# Patient Record
Sex: Female | Born: 1955
Health system: Southern US, Community
[De-identification: ages and names within clinical notes are randomized; demographics above are authoritative.]

## PROBLEM LIST (undated history)

## (undated) DIAGNOSIS — M810 Age-related osteoporosis without current pathological fracture: Secondary | ICD-10-CM

## (undated) DIAGNOSIS — Z860100 Personal history of colon polyps, unspecified: Secondary | ICD-10-CM

## (undated) DIAGNOSIS — K8020 Calculus of gallbladder without cholecystitis without obstruction: Secondary | ICD-10-CM

## (undated) DIAGNOSIS — F419 Anxiety disorder, unspecified: Secondary | ICD-10-CM

## (undated) DIAGNOSIS — M719 Bursopathy, unspecified: Secondary | ICD-10-CM

## (undated) DIAGNOSIS — I4891 Unspecified atrial fibrillation: Secondary | ICD-10-CM

## (undated) DIAGNOSIS — D219 Benign neoplasm of connective and other soft tissue, unspecified: Secondary | ICD-10-CM

## (undated) DIAGNOSIS — E7800 Pure hypercholesterolemia, unspecified: Secondary | ICD-10-CM

## (undated) DIAGNOSIS — N809 Endometriosis, unspecified: Secondary | ICD-10-CM

## (undated) DIAGNOSIS — K219 Gastro-esophageal reflux disease without esophagitis: Secondary | ICD-10-CM

## (undated) DIAGNOSIS — M1990 Unspecified osteoarthritis, unspecified site: Secondary | ICD-10-CM

## (undated) DIAGNOSIS — E039 Hypothyroidism, unspecified: Secondary | ICD-10-CM

## (undated) HISTORY — PX: TONSILLECTOMY AND ADENOIDECTOMY: SUR1326

## (undated) HISTORY — PX: COLONOSCOPY: SHX174

## (undated) HISTORY — DX: Anxiety disorder, unspecified: F41.9

## (undated) HISTORY — DX: Unspecified atrial fibrillation: I48.91

## (undated) HISTORY — DX: Calculus of gallbladder without cholecystitis without obstruction: K80.20

## (undated) HISTORY — DX: Age-related osteoporosis without current pathological fracture: M81.0

## (undated) HISTORY — DX: Gastro-esophageal reflux disease without esophagitis: K21.9

## (undated) HISTORY — DX: Personal history of colonic polyps: Z86.010

## (undated) HISTORY — DX: Pure hypercholesterolemia, unspecified: E78.00

## (undated) HISTORY — DX: Benign neoplasm of connective and other soft tissue, unspecified: D21.9

## (undated) HISTORY — DX: Personal history of colon polyps, unspecified: Z86.0100

## (undated) HISTORY — DX: Bursopathy, unspecified: M71.9

## (undated) HISTORY — DX: Endometriosis, unspecified: N80.9

## (undated) HISTORY — DX: Unspecified osteoarthritis, unspecified site: M19.90

---

## 1998-04-09 ENCOUNTER — Other Ambulatory Visit: Admission: RE | Admit: 1998-04-09 | Discharge: 1998-04-09 | Payer: Self-pay | Admitting: Obstetrics and Gynecology

## 1999-04-15 ENCOUNTER — Other Ambulatory Visit: Admission: RE | Admit: 1999-04-15 | Discharge: 1999-04-15 | Payer: Self-pay | Admitting: Obstetrics and Gynecology

## 1999-07-15 ENCOUNTER — Encounter: Payer: Self-pay | Admitting: Obstetrics and Gynecology

## 1999-07-15 ENCOUNTER — Encounter: Admission: RE | Admit: 1999-07-15 | Discharge: 1999-07-15 | Payer: Self-pay | Admitting: Obstetrics and Gynecology

## 2000-05-25 ENCOUNTER — Other Ambulatory Visit: Admission: RE | Admit: 2000-05-25 | Discharge: 2000-05-25 | Payer: Self-pay | Admitting: Obstetrics and Gynecology

## 2000-08-03 ENCOUNTER — Encounter: Payer: Self-pay | Admitting: Obstetrics and Gynecology

## 2000-08-03 ENCOUNTER — Encounter: Admission: RE | Admit: 2000-08-03 | Discharge: 2000-08-03 | Payer: Self-pay | Admitting: Obstetrics and Gynecology

## 2001-06-14 ENCOUNTER — Other Ambulatory Visit: Admission: RE | Admit: 2001-06-14 | Discharge: 2001-06-14 | Payer: Self-pay | Admitting: Obstetrics and Gynecology

## 2001-08-09 ENCOUNTER — Encounter: Admission: RE | Admit: 2001-08-09 | Discharge: 2001-08-09 | Payer: Self-pay | Admitting: Obstetrics and Gynecology

## 2001-08-09 ENCOUNTER — Encounter: Payer: Self-pay | Admitting: Obstetrics and Gynecology

## 2002-05-30 ENCOUNTER — Other Ambulatory Visit: Admission: RE | Admit: 2002-05-30 | Discharge: 2002-05-30 | Payer: Self-pay | Admitting: Obstetrics and Gynecology

## 2002-08-15 ENCOUNTER — Encounter: Admission: RE | Admit: 2002-08-15 | Discharge: 2002-08-15 | Payer: Self-pay | Admitting: Obstetrics and Gynecology

## 2002-08-15 ENCOUNTER — Encounter: Payer: Self-pay | Admitting: Obstetrics and Gynecology

## 2003-06-05 ENCOUNTER — Other Ambulatory Visit: Admission: RE | Admit: 2003-06-05 | Discharge: 2003-06-05 | Payer: Self-pay | Admitting: Obstetrics and Gynecology

## 2003-08-28 ENCOUNTER — Encounter: Admission: RE | Admit: 2003-08-28 | Discharge: 2003-08-28 | Payer: Self-pay | Admitting: Obstetrics and Gynecology

## 2004-06-17 ENCOUNTER — Other Ambulatory Visit: Admission: RE | Admit: 2004-06-17 | Discharge: 2004-06-17 | Payer: Self-pay | Admitting: Obstetrics and Gynecology

## 2004-09-16 ENCOUNTER — Encounter: Admission: RE | Admit: 2004-09-16 | Discharge: 2004-09-16 | Payer: Self-pay | Admitting: Obstetrics and Gynecology

## 2005-08-14 ENCOUNTER — Other Ambulatory Visit: Admission: RE | Admit: 2005-08-14 | Discharge: 2005-08-14 | Payer: Self-pay | Admitting: Obstetrics and Gynecology

## 2005-09-22 ENCOUNTER — Encounter: Admission: RE | Admit: 2005-09-22 | Discharge: 2005-09-22 | Payer: Self-pay | Admitting: Obstetrics and Gynecology

## 2006-08-31 ENCOUNTER — Other Ambulatory Visit: Admission: RE | Admit: 2006-08-31 | Discharge: 2006-08-31 | Payer: Self-pay | Admitting: Obstetrics and Gynecology

## 2006-09-28 ENCOUNTER — Encounter: Admission: RE | Admit: 2006-09-28 | Discharge: 2006-09-28 | Payer: Self-pay | Admitting: Obstetrics and Gynecology

## 2007-10-04 ENCOUNTER — Encounter: Admission: RE | Admit: 2007-10-04 | Discharge: 2007-10-04 | Payer: Self-pay | Admitting: Obstetrics and Gynecology

## 2007-11-08 ENCOUNTER — Other Ambulatory Visit: Admission: RE | Admit: 2007-11-08 | Discharge: 2007-11-08 | Payer: Self-pay | Admitting: Obstetrics and Gynecology

## 2008-10-09 ENCOUNTER — Encounter: Admission: RE | Admit: 2008-10-09 | Discharge: 2008-10-09 | Payer: Self-pay | Admitting: Obstetrics and Gynecology

## 2008-11-13 ENCOUNTER — Other Ambulatory Visit: Admission: RE | Admit: 2008-11-13 | Discharge: 2008-11-13 | Payer: Self-pay | Admitting: Obstetrics and Gynecology

## 2008-11-17 DIAGNOSIS — N8003 Adenomyosis of the uterus: Secondary | ICD-10-CM

## 2008-11-17 DIAGNOSIS — N800 Endometriosis of uterus: Secondary | ICD-10-CM

## 2008-11-17 DIAGNOSIS — D219 Benign neoplasm of connective and other soft tissue, unspecified: Secondary | ICD-10-CM

## 2008-11-17 HISTORY — DX: Endometriosis of uterus: N80.0

## 2008-11-17 HISTORY — DX: Adenomyosis of the uterus: N80.03

## 2008-11-17 HISTORY — DX: Benign neoplasm of connective and other soft tissue, unspecified: D21.9

## 2009-10-29 ENCOUNTER — Encounter: Admission: RE | Admit: 2009-10-29 | Discharge: 2009-10-29 | Payer: Self-pay | Admitting: Obstetrics and Gynecology

## 2010-09-23 ENCOUNTER — Other Ambulatory Visit: Payer: Self-pay | Admitting: Obstetrics and Gynecology

## 2010-09-23 DIAGNOSIS — Z1231 Encounter for screening mammogram for malignant neoplasm of breast: Secondary | ICD-10-CM

## 2010-11-04 ENCOUNTER — Ambulatory Visit
Admission: RE | Admit: 2010-11-04 | Discharge: 2010-11-04 | Disposition: A | Discharge status: Home / Self Care | Payer: PRIVATE HEALTH INSURANCE | Source: Ambulatory Visit | Attending: Obstetrics and Gynecology | Admitting: Obstetrics and Gynecology

## 2010-11-04 DIAGNOSIS — Z1231 Encounter for screening mammogram for malignant neoplasm of breast: Secondary | ICD-10-CM

## 2011-10-09 ENCOUNTER — Other Ambulatory Visit: Payer: Self-pay | Admitting: Obstetrics and Gynecology

## 2011-10-09 DIAGNOSIS — Z1231 Encounter for screening mammogram for malignant neoplasm of breast: Secondary | ICD-10-CM

## 2011-11-10 ENCOUNTER — Ambulatory Visit
Admission: RE | Admit: 2011-11-10 | Discharge: 2011-11-10 | Disposition: A | Discharge status: Home / Self Care | Payer: PRIVATE HEALTH INSURANCE | Source: Ambulatory Visit | Attending: Obstetrics and Gynecology | Admitting: Obstetrics and Gynecology

## 2011-11-10 DIAGNOSIS — Z1231 Encounter for screening mammogram for malignant neoplasm of breast: Secondary | ICD-10-CM

## 2012-10-25 ENCOUNTER — Other Ambulatory Visit: Payer: Self-pay

## 2012-10-25 DIAGNOSIS — Z1231 Encounter for screening mammogram for malignant neoplasm of breast: Secondary | ICD-10-CM

## 2012-11-11 ENCOUNTER — Ambulatory Visit
Admission: RE | Admit: 2012-11-11 | Discharge: 2012-11-11 | Disposition: A | Discharge status: Home / Self Care | Payer: BC Managed Care – PPO | Source: Ambulatory Visit

## 2012-11-11 DIAGNOSIS — Z1231 Encounter for screening mammogram for malignant neoplasm of breast: Secondary | ICD-10-CM

## 2013-01-23 ENCOUNTER — Encounter: Payer: Self-pay | Admitting: Obstetrics and Gynecology

## 2013-01-24 ENCOUNTER — Encounter: Payer: Self-pay | Admitting: Obstetrics and Gynecology

## 2013-01-24 ENCOUNTER — Ambulatory Visit (INDEPENDENT_AMBULATORY_CARE_PROVIDER_SITE_OTHER): Payer: BC Managed Care – PPO | Admitting: Obstetrics and Gynecology

## 2013-01-24 VITALS — BP 108/60 | Ht 63.0 in | Wt 163.0 lb

## 2013-01-24 DIAGNOSIS — Z01419 Encounter for gynecological examination (general) (routine) without abnormal findings: Secondary | ICD-10-CM

## 2013-01-24 MED ORDER — LEVOTHYROXINE SODIUM 50 MCG PO TABS
50.0000 ug | ORAL_TABLET | Freq: Every day | ORAL | Status: DC
Start: 2013-01-24 — End: 2014-02-16

## 2013-01-24 MED ORDER — VITAMIN D (ERGOCALCIFEROL) 1.25 MG (50000 UNIT) PO CAPS: 50000.0000 [IU] | ORAL_CAPSULE | ORAL | Status: DC

## 2013-01-24 MED ORDER — SERTRALINE HCL 50 MG PO TABS: 50.0000 mg | ORAL_TABLET | Freq: Every day | ORAL | Status: DC

## 2013-01-24 NOTE — Progress Notes (Addendum)
57 y.o.   Single    Caucasian   female   G0P0   here for annual exam.  Only took synthroid for about a month after our last vist b/c she felt nervous/shaky.  But she drew a TSH recently and it was 4.12.  Now she is willing to try synthroid again.  Likes zoloft and wants to continue.  "I think it keeps me working"  Patient's last menstrual period was 10/11/2010.          Sexually active: no  The current method of family planning is abstinence.    Exercising: tennis 2 days a week, walking 1-2 days a week Last mammogram:  April or May 2014 normal Last pap smear:12/10/09 neg History of abnormal pap: no Smoking: never Alcohol:3 glasses of wine a week Last colonoscopy:11/2011 normal, repeat in 7 years Last Bone Density: 2003  Last tetanus shot:2008 Last cholesterol check: 12/26/12 normal  Hgb:  pcp              Urine:pcp   Family History  Problem Relation Age of Onset  . Cirrhosis Mother     There are no active problems to display for this patient.   Past Medical History  Diagnosis Date  . Fibroid 11/2008  . Adenomyosis 11/2008     on PUS  . Osteoarthritis     hands /rt shoulder  . Anxiety     Past Surgical History  Procedure Laterality Date  . Tonsillectomy and adenoidectomy      age 50    Allergies: Review of patient's allergies indicates not on file.  Current Outpatient Prescriptions  Medication Sig Dispense Refill  . IBUPROFEN PO Take by mouth as needed.      . Omeprazole (PRILOSEC PO) Take by mouth as needed.      . rosuvastatin (CRESTOR) 5 MG tablet Take 5 mg by mouth daily. 1/2 of 5mg  tab every other day      . sertraline (ZOLOFT) 50 MG tablet Take 50 mg by mouth daily. 75mg  daily      . Vitamin D, Ergocalciferol, (DRISDOL) 50000 UNITS CAPS Take 50,000 Units by mouth every 14 (fourteen) days.       No current facility-administered medications for this visit.    ROS: Pertinent items are noted in HPI.  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 Clyde Canterbury who is retiring.   Exam:    BP 108/60  Ht 5\' 3"  (1.6 m)  Wt 163 lb (73.936 kg)  BMI 28.88 kg/m2  LMP 10/11/2010   Wt Readings from Last 3 Encounters:  01/24/13 163 lb (73.936 kg)     Ht Readings from Last 3 Encounters:  01/24/13 5\' 3"  (1.6 m)    General appearance: alert, cooperative and appears stated age Head: Normocephalic, without obvious abnormality, atraumatic Neck: no adenopathy, supple, symmetrical, trachea midline and thyroid not enlarged, symmetric, no tenderness/mass/nodules Lungs: clear to auscultation bilaterally Breasts: Inspection negative, No nipple retraction or dimpling, No nipple discharge or bleeding, No axillary or supraclavicular adenopathy, Normal to palpation without dominant masses Heart: regular rate and rhythm Abdomen: soft, non-tender; bowel sounds normal; no masses,  no organomegaly Extremities: extremities normal, atraumatic, no cyanosis or edema Skin: Skin color, texture, turgor normal. No rashes or lesions Lymph nodes: Cervical, supraclavicular, and axillary nodes normal. No abnormal inguinal nodes palpated Neurologic: Grossly normal   Pelvic: External genitalia:  no lesions  Urethra:  normal appearing urethra with no masses, tenderness or lesions              Bartholins and Skenes: normal                 Vagina: normal appearing vagina with normal color and discharge, no lesions              Cervix: normal appearance              Pap taken: yes        Bimanual Exam:  Uterus:  uterus is normal size, shape, consistency and nontender                                      Adnexa: normal adnexa in size, nontender and no masses                                      Rectovaginal: Confirms                                      Anus:  normal sphincter tone, no lesions  A: normal menopausal exam, no HRT     Hypothyroid, elevated cholesterol     P: mammogram pap  smear counseled on breast self exam, mammography screening, adequate intake of calcium and vitamin D, diet and exercise return annually or prn   Start synthroid 50 mcg per day and recheck TSH (pt will do at her office and send over here)in 6 weeks. RF zoloft and vit d as per orders.     An After Visit Summary was printed and given to the patient.

## 2013-01-24 NOTE — Patient Instructions (Signed)

## 2013-01-27 LAB — IPS PAP TEST WITH HPV

## 2013-02-20 ENCOUNTER — Other Ambulatory Visit: Payer: Self-pay | Admitting: Obstetrics and Gynecology

## 2013-02-21 NOTE — Telephone Encounter (Signed)
#  135/3 rfs was sent to [facility] 01/24/13

## 2013-10-10 ENCOUNTER — Other Ambulatory Visit: Payer: Self-pay

## 2013-10-10 DIAGNOSIS — Z1231 Encounter for screening mammogram for malignant neoplasm of breast: Secondary | ICD-10-CM

## 2013-11-01 ENCOUNTER — Encounter: Payer: Self-pay | Admitting: Obstetrics and Gynecology

## 2013-11-14 ENCOUNTER — Ambulatory Visit
Admission: RE | Admit: 2013-11-14 | Discharge: 2013-11-14 | Disposition: A | Discharge status: Home / Self Care | Payer: Self-pay | Source: Ambulatory Visit

## 2013-11-14 ENCOUNTER — Encounter (INDEPENDENT_AMBULATORY_CARE_PROVIDER_SITE_OTHER): Payer: Self-pay

## 2013-11-14 DIAGNOSIS — Z1231 Encounter for screening mammogram for malignant neoplasm of breast: Secondary | ICD-10-CM

## 2013-12-08 ENCOUNTER — Ambulatory Visit
Admission: RE | Admit: 2013-12-08 | Discharge: 2013-12-08 | Disposition: A | Discharge status: Home / Self Care | Payer: BC Managed Care – PPO | Source: Ambulatory Visit | Attending: Family Medicine | Admitting: Family Medicine

## 2013-12-08 ENCOUNTER — Other Ambulatory Visit: Payer: Self-pay | Admitting: Family Medicine

## 2013-12-08 DIAGNOSIS — R05 Cough: Secondary | ICD-10-CM

## 2013-12-08 DIAGNOSIS — R059 Cough, unspecified: Secondary | ICD-10-CM

## 2014-01-26 ENCOUNTER — Ambulatory Visit: Payer: BC Managed Care – PPO | Admitting: Obstetrics and Gynecology

## 2014-02-07 ENCOUNTER — Ambulatory Visit: Payer: BC Managed Care – PPO | Admitting: Obstetrics and Gynecology

## 2014-02-09 ENCOUNTER — Telehealth: Payer: Self-pay | Admitting: Obstetrics and Gynecology

## 2014-02-09 NOTE — Telephone Encounter (Signed)
Confirming pts appt °

## 2014-02-16 ENCOUNTER — Encounter: Payer: Self-pay | Admitting: Obstetrics and Gynecology

## 2014-02-16 ENCOUNTER — Ambulatory Visit (INDEPENDENT_AMBULATORY_CARE_PROVIDER_SITE_OTHER): Payer: BC Managed Care – PPO | Admitting: Obstetrics and Gynecology

## 2014-02-16 VITALS — BP 112/66 | HR 72 | Resp 18 | Ht 63.5 in | Wt 164.0 lb

## 2014-02-16 DIAGNOSIS — Z01419 Encounter for gynecological examination (general) (routine) without abnormal findings: Secondary | ICD-10-CM

## 2014-02-16 MED ORDER — SERTRALINE HCL 50 MG PO TABS: ORAL_TABLET | ORAL | Status: DC

## 2014-02-16 MED ORDER — LEVOTHYROXINE SODIUM 50 MCG PO TABS: 25.0000 ug | ORAL_TABLET | Freq: Every day | ORAL | Status: DC

## 2014-02-16 MED ORDER — VITAMIN D (ERGOCALCIFEROL) 1.25 MG (50000 UNIT) PO CAPS: 50000.0000 [IU] | ORAL_CAPSULE | ORAL | Status: DC

## 2014-02-16 NOTE — Progress Notes (Signed)
GYNECOLOGY VISIT  PCP:   Referring provider:   HPI: 58 y.o.   Single  Caucasian  female   G0P0 with Patient's last menstrual period was 10/11/2010.   here for   Annual Gynecological Examination   Zoloft, synthroid, Vit D Rx through this office.  Taking Synthroid 25 mg daily.  Taking Crestor every other day.  Using samples from her medical office.   Labs 12/14/13: TSH 2.52 Glucose 74 GFR 60 Hgb 13  Labs 01/26/14: T chol 189 T chol/HDL 2.7 Trig 75 LCLc 105 HDLD 69  Vit D 32.6  Hgb: 13.0 - 12/14/13 Urine:  N/A  GYNECOLOGIC HISTORY: Patient's last menstrual period was 10/11/2010. Sexually active:  No Partner preference: Female Contraception: Post-Menopausal  Menopausal hormone therapy: No DES exposure: No   Blood transfusions: No    Sexually transmitted diseases:  No  GYN procedures and prior surgeries:  No Last mammogram:  10/2013 BIRADS1: neg              Last pap and high risk HPV testing:  01/2013 Neg. HR HPV Neg  History of abnormal pap smear:  No   OB History   Grav Para Term Preterm Abortions TAB SAB Ect Mult Living   0 0               LIFESTYLE: Exercise:  Yes, Walk, tennis 2 - 3 x weekly            Tobacco: No Alcohol: yes, 3 glasses of wine weekly Drug use:  No  OTHER HEALTH MAINTENANCE: Tetanus/TDap: 2008 Gardisil: No Influenza:  No Zostavax: No  Bone density: 2003 Colonoscopy: 2013 Repeat 7 years   Cholesterol check: 01/2014  Family History  Problem Relation Age of Onset  . Cirrhosis Mother     There are no active problems to display for this patient.  Past Medical History  Diagnosis Date  . Fibroid 11/2008  . Adenomyosis 11/2008     on PUS  . Osteoarthritis     hands /rt shoulder  . Anxiety     Past Surgical History  Procedure Laterality Date  . Tonsillectomy and adenoidectomy      age 62    ALLERGIES: Review of patient's allergies indicates no known allergies.  Current Outpatient Prescriptions  Medication Sig Dispense  Refill  . levothyroxine (SYNTHROID, LEVOTHROID) 50 MCG tablet Take 25 mcg by mouth daily.      . Omeprazole (PRILOSEC PO) Take by mouth as needed.      . rosuvastatin (CRESTOR) 5 MG tablet Take 5 mg by mouth daily. 1/2 of 5mg  tab every other day      . sertraline (ZOLOFT) 50 MG tablet Take 1 tablet (50 mg total) by mouth daily. 75mg  daily  135 tablet  3  . Vitamin D, Ergocalciferol, (DRISDOL) 50000 UNITS CAPS Take 1 capsule (50,000 Units total) by mouth every 14 (fourteen) days.  26 capsule  0  . IBUPROFEN PO Take by mouth as needed.       No current facility-administered medications for this visit.     ROS:  Pertinent items are noted in HPI.  SOCIAL HISTORY:  Single.  Physician.   PHYSICAL EXAMINATION:    BP 112/66  Pulse 72  Resp 18  Ht 5' 3.5" (1.613 m)  Wt 164 lb (74.39 kg)  BMI 28.59 kg/m2  LMP 10/11/2010   Wt Readings from Last 3 Encounters:  02/16/14 164 lb (74.39 kg)  01/24/13 163 lb (73.936 kg)  Ht Readings from Last 3 Encounters:  02/16/14 5' 3.5" (1.613 m)  01/24/13 5\' 3"  (1.6 m)    General appearance: alert, cooperative and appears stated age Head: Normocephalic, without obvious abnormality, atraumatic Neck: no adenopathy, supple, symmetrical, trachea midline and thyroid not enlarged, symmetric, no tenderness/mass/nodules Lungs: clear to auscultation bilaterally Breasts: Inspection negative, No nipple retraction or dimpling, No nipple discharge or bleeding, No axillary or supraclavicular adenopathy, Normal to palpation without dominant masses Heart: regular rate and rhythm Abdomen: soft, non-tender; no masses,  no organomegaly Extremities: extremities normal, atraumatic, no cyanosis or edema Skin: Skin color, texture, turgor normal. No rashes or lesions Lymph nodes: Cervical, supraclavicular, and axillary nodes normal. No abnormal inguinal nodes palpated Neurologic: Grossly normal  Pelvic: External genitalia:  no lesions              Urethra:  normal  appearing urethra with no masses, tenderness or lesions              Bartholins and Skenes: normal                 Vagina: normal appearing vagina with normal color and discharge, no lesions              Cervix: normal appearance              Pap and high risk HPV testing done: No.        Bimanual Exam:  Uterus:  uterus is normal size, shape, consistency and nontender                                      Adnexa: normal adnexa in size, nontender and no masses                                      Rectovaginal: Confirms                                      Anus:  normal sphincter tone, no lesions  ASSESSMENT  Normal gynecologic exam. Hyperlipidemia Hypothyroidism Anxiety   PLAN  Mammogram recommended yearly.  Pap smear and high risk HPV testing not indicated. Counseled on self breast exam, Calcium and vitamin D intake, exercise. Refills of Zoloft, Synthroid, and Vit D.   See EPIC. Return annually or prn   An After Visit Summary was printed and given to the patient.

## 2014-02-16 NOTE — Patient Instructions (Signed)

## 2014-06-06 ENCOUNTER — Telehealth: Payer: Self-pay | Admitting: Obstetrics and Gynecology

## 2014-06-06 NOTE — Telephone Encounter (Signed)
Left message regarding upcoming appointment time change.

## 2014-10-30 ENCOUNTER — Other Ambulatory Visit: Payer: Self-pay

## 2014-10-30 DIAGNOSIS — Z1231 Encounter for screening mammogram for malignant neoplasm of breast: Secondary | ICD-10-CM

## 2014-11-27 ENCOUNTER — Ambulatory Visit: Payer: Self-pay

## 2014-12-11 ENCOUNTER — Ambulatory Visit
Admission: RE | Admit: 2014-12-11 | Discharge: 2014-12-11 | Disposition: A | Discharge status: Home / Self Care | Payer: BLUE CROSS/BLUE SHIELD | Source: Ambulatory Visit

## 2014-12-11 DIAGNOSIS — Z1231 Encounter for screening mammogram for malignant neoplasm of breast: Secondary | ICD-10-CM

## 2015-02-18 ENCOUNTER — Other Ambulatory Visit: Payer: Self-pay | Admitting: Obstetrics and Gynecology

## 2015-02-18 NOTE — Telephone Encounter (Signed)
Medication refill request: Sertraline 50 mg Last AEX:  02/16/14 with BS Next AEX: 02/22/15 with BS Last MMG (if hormonal medication request): n/a Refill authorized: #135/0 rfs

## 2015-02-21 ENCOUNTER — Other Ambulatory Visit: Payer: Self-pay | Admitting: Obstetrics and Gynecology

## 2015-02-21 NOTE — Telephone Encounter (Signed)
Medication refill request: Vitamin D Last AEX:  02/16/14 Next AEX: 02/22/15  Last MMG (if hormonal medication request): 12/12/14 BIRADS1:Neg Refill authorized: 02/16/14 #26/0R.   Patient has appt tomorrow. Please advise.

## 2015-02-22 ENCOUNTER — Ambulatory Visit (INDEPENDENT_AMBULATORY_CARE_PROVIDER_SITE_OTHER): Payer: BLUE CROSS/BLUE SHIELD | Admitting: Obstetrics and Gynecology

## 2015-02-22 ENCOUNTER — Ambulatory Visit: Payer: BC Managed Care – PPO | Admitting: Obstetrics and Gynecology

## 2015-02-22 ENCOUNTER — Encounter: Payer: Self-pay | Admitting: Obstetrics and Gynecology

## 2015-02-22 VITALS — BP 110/66 | HR 72 | Resp 20 | Ht 63.25 in | Wt 163.0 lb

## 2015-02-22 DIAGNOSIS — M25559 Pain in unspecified hip: Secondary | ICD-10-CM | POA: Diagnosis not present

## 2015-02-22 DIAGNOSIS — Z01419 Encounter for gynecological examination (general) (routine) without abnormal findings: Secondary | ICD-10-CM | POA: Diagnosis not present

## 2015-02-22 MED ORDER — SERTRALINE HCL 50 MG PO TABS: ORAL_TABLET | ORAL | Status: DC

## 2015-02-22 MED ORDER — LEVOTHYROXINE SODIUM 50 MCG PO TABS: 25.0000 ug | ORAL_TABLET | Freq: Every day | ORAL | Status: DC

## 2015-02-22 MED ORDER — VITAMIN D (ERGOCALCIFEROL) 1.25 MG (50000 UNIT) PO CAPS: 50000.0000 [IU] | ORAL_CAPSULE | ORAL | Status: DC

## 2015-02-22 NOTE — Progress Notes (Signed)
59 y.o. G0P0 Single Caucasian female here for annual exam.    Lower abdominal cramping.  No bleeding.  Hx of adenomyosis.   Brings in a copy of her labs today from 10/18/14 - glucose 109, Tchol 186, HDL 66, LDL 101, TG 95, TSH 2.52, vit D 25.9. 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   Going to the [place] for a medical conference.  Patient is a physician.   PCP:  No PCP   Patient's last menstrual period was 10/11/2010.          Sexually active: No.  The current method of family planning is post menopausal status.    Exercising: Yes.    Tennis 1-2 x weekly, walk 1- 2 x weekly Smoker:  no  Health Maintenance: Pap:  01/24/13 Neg. HR HPV:neg History of abnormal Pap:  no MMG:  12/12/14 BIRADS1:neg Colonoscopy:  2013 - repeat 7 years  BMD:  ~ 2003 - Mild Osteopenia - per pt TDaP:  2008 Screening Labs: Done 09/2014 Hg: 12.3 , Urine today: Declined.    reports that she has never smoked. She has never used smokeless tobacco. She reports that she drinks about 1.8 oz of alcohol per week. She reports that she does not use illicit drugs.  Past Medical History  Diagnosis Date  . Fibroid 11/2008  . Adenomyosis 11/2008     on PUS  . Osteoarthritis     hands /rt shoulder  . Anxiety     Past Surgical History  Procedure Laterality Date  . Tonsillectomy and adenoidectomy      age 44    Current Outpatient Prescriptions  Medication Sig Dispense Refill  . IBUPROFEN PO Take by mouth as needed.    Marland Kitchen levothyroxine (SYNTHROID, LEVOTHROID) 50 MCG tablet Take 0.5 tablets (25 mcg total) by mouth daily. 90 tablet 3  . Omeprazole (PRILOSEC PO) Take by mouth as needed.    . sertraline (ZOLOFT) 50 MG tablet TAKE 1.5 TABLETS BY MOUTH DAILY 135 tablet 0  . Vitamin D, Ergocalciferol, (DRISDOL) 50000 UNITS CAPS capsule Take 1 capsule (50,000 Units total) by mouth every 14 (fourteen) days. 26 capsule 0   No current facility-administered  medications for this visit.    Family History  Problem Relation Age of Onset  . Cirrhosis Mother     ROS:  Pertinent items are noted in HPI.  Otherwise, a comprehensive ROS was negative.  Exam:   BP 110/66 mmHg  Pulse 72  Resp 20  Ht 5' 3.25" (1.607 m)  Wt 163 lb (73.936 kg)  BMI 28.63 kg/m2  LMP 10/11/2010    General appearance: alert, cooperative and appears stated age Head: Normocephalic, without obvious abnormality, atraumatic Neck: no adenopathy, supple, symmetrical, trachea midline and thyroid normal to inspection and palpation Lungs: clear to auscultation bilaterally Breasts: normal appearance, no masses or tenderness, Inspection negative, No nipple retraction or dimpling, No nipple discharge or bleeding, No axillary or supraclavicular adenopathy Heart: regular rate and rhythm Abdomen: soft, non-tender; bowel sounds normal; no masses,  no organomegaly Extremities: extremities normal, atraumatic, no cyanosis or edema Skin: Skin color, texture, turgor normal. No rashes or lesions Lymph nodes: Cervical, supraclavicular, and axillary nodes normal. No abnormal inguinal nodes palpated Neurologic: Grossly normal  Pelvic: External genitalia:  no lesions              Urethra:  normal appearing urethra with no masses, tenderness or lesions  Bartholins and Skenes: normal                 Vagina: normal appearing vagina with normal color and discharge, no lesions              Cervix: no lesions              Pap taken: No. Bimanual Exam:  Uterus:  normal size, contour, position, consistency, mobility, non-tender              Adnexa: normal adnexa and no mass, fullness, tenderness              Rectovaginal: Yes.  .  Confirms.              Anus:  normal sphincter tone, no lesions  Chaperone was present for exam.  Assessment:   Well woman visit with normal exam. Abdominal/pelvic cramping. Hx of adenomyosis. No postmenopausal bleeding.  Osteopenia.  Vit D deficiency.   Hypothyroidism.  Mild hyperglycemia. Anxiety.  On Zoloft.   Plan: Yearly mammogram recommended after age 67.  Recommended self breast exam.  Pap and HR HPV as above. Discussed Calcium, Vitamin D, regular exercise program including cardiovascular and weight bearing exercise. Labs performed.  No..     Patient will do follow up glucose testing at her office.  Refills given on medications.  Yes.  .  See orders.  Vitamin D, Synthroid, Zoloft Rxs - all for one year.  Proceed with pelvic ultrasound.  OK for a Tuesday appointment and then a phone call with results. Bone density Rx given for patient to do at [facility].  Follow up annually and prn.      After visit summary provided.

## 2015-02-25 ENCOUNTER — Telehealth: Payer: Self-pay | Admitting: Obstetrics and Gynecology

## 2015-02-25 NOTE — Telephone Encounter (Signed)
Patient is calling to schedule her appointment for any ultrasound. [place] will [place] of the country after tomorrow for 1 week. She is asking for a return call about the appointment by tormorrow afternoon if possible.

## 2015-03-21 NOTE — Telephone Encounter (Signed)
Called patient to review benefits for procedure. Left voicemail to call back and review. °

## 2015-03-29 ENCOUNTER — Telehealth: Payer: Self-pay | Admitting: Obstetrics and Gynecology

## 2015-03-29 NOTE — Telephone Encounter (Signed)
Called patient to review benefits on 03/21/15. Patient returned call today. Reviewed benefits for PUS. Patient states she would like to speak with Dr Quincy Simmonds prior to scheduling. Patient does not wish to schedule at this time.

## 2015-03-29 NOTE — Telephone Encounter (Signed)
Spoke with patient. Scheduled for PUS only on 04/09/15 (next available Tuesday ultrasound). Patient agreeable to follow up call from Dr Quincy Simmonds. Benefit adjusted based on PUS only - no office visit per Dr Quincy Simmonds. Patient understands and agreeable. Reviewed 72 hour cancellation policy. Patient understands and agreeable. Ok to close.

## 2015-03-29 NOTE — Telephone Encounter (Signed)
Plan discussed with patient by phone now. She will return for a pelvic ultrasound on a Tuesday and then I will call her with results.

## 2015-04-09 ENCOUNTER — Ambulatory Visit (INDEPENDENT_AMBULATORY_CARE_PROVIDER_SITE_OTHER): Payer: BLUE CROSS/BLUE SHIELD

## 2015-04-09 DIAGNOSIS — M25559 Pain in unspecified hip: Secondary | ICD-10-CM

## 2015-04-10 ENCOUNTER — Telehealth: Payer: Self-pay | Admitting: Obstetrics and Gynecology

## 2015-04-10 NOTE — Telephone Encounter (Addendum)
Phone call to discuss ultrasound results showing two small fibroids of the uterus - largest 2.3 cm and subserosal.  Unchanged form 2010 ultrasound.  Ovaries normal.  EMS thin, 2.5 mm with no abnormal blood flow. No free fluid.   No answer on home phone.   I left message on machine that I was calling with good news and requested a return call. No details left. I also stated I would try to send results through My Chart if activated (Not activated, so not possible).

## 2015-04-18 NOTE — Telephone Encounter (Signed)
Phone call to home phone per DPI.  Calling back to review last visit in office and share good news.   I requested patient call the office if she has questions about her last office visit.   I will close the encounter.

## 2015-07-21 DIAGNOSIS — E7800 Pure hypercholesterolemia, unspecified: Secondary | ICD-10-CM

## 2015-07-21 HISTORY — DX: Pure hypercholesterolemia, unspecified: E78.00

## 2015-11-12 ENCOUNTER — Other Ambulatory Visit: Payer: Self-pay

## 2015-11-12 DIAGNOSIS — Z1231 Encounter for screening mammogram for malignant neoplasm of breast: Secondary | ICD-10-CM

## 2015-12-24 ENCOUNTER — Ambulatory Visit
Admission: RE | Admit: 2015-12-24 | Discharge: 2015-12-24 | Disposition: A | Discharge status: Home / Self Care | Payer: BLUE CROSS/BLUE SHIELD | Source: Ambulatory Visit

## 2015-12-24 ENCOUNTER — Other Ambulatory Visit: Payer: Self-pay | Admitting: Obstetrics and Gynecology

## 2015-12-24 DIAGNOSIS — Z1231 Encounter for screening mammogram for malignant neoplasm of breast: Secondary | ICD-10-CM

## 2016-01-18 DIAGNOSIS — M810 Age-related osteoporosis without current pathological fracture: Secondary | ICD-10-CM

## 2016-01-18 HISTORY — DX: Age-related osteoporosis without current pathological fracture: M81.0

## 2016-01-21 ENCOUNTER — Encounter: Payer: Self-pay | Admitting: Obstetrics and Gynecology

## 2016-01-23 ENCOUNTER — Telehealth: Payer: Self-pay | Admitting: *Deleted

## 2016-01-23 NOTE — Telephone Encounter (Signed)
Left message to call Chane Cowden [phone].

## 2016-01-24 NOTE — Telephone Encounter (Signed)
Returned call to patient. Patient stating one of her partner's at [facility] is referring physician for BMD, but she requests that Dr. Quincy Simmonds follow and treat the osteoporosis.   Will route to provider for review. Will close encounter.

## 2016-01-24 NOTE — Telephone Encounter (Signed)
Patient returned call

## 2016-01-24 NOTE — Telephone Encounter (Signed)
I need the copy of her bone density report back to my desk please.  I believe it has not gone to the scan center yet.

## 2016-01-27 ENCOUNTER — Telehealth: Payer: Self-pay | Admitting: Obstetrics and Gynecology

## 2016-01-27 NOTE — Telephone Encounter (Signed)
Phone call to patient at home.  I had to leave a message on home phone to return call.   Care plan is as follows:  BMD at [facility] showing osteoporosis of the spine and left hip. 01/07/16.  I am recommending Actonel 150 mg monthly.  It is probably more convenient to take it this way as she needs to remain upright for about a 1/2 hour and take it on an empty stomach.   If she has labs to share from this year - Calcium, vit D, and creatine, I would like to have them scanned in to [facility].

## 2016-01-28 NOTE — Telephone Encounter (Signed)
I thought the monthly dosing for Actonel may be appealing.  Actonel may have less GI side effects than the Fosamax.  Both Actonel and Fosamax are great options with no one significantly better than the other.  If Dr. Inda Merlin has a preference for dosing frequency or a specific formulary, I am happy to switch!  Thanks.

## 2016-01-28 NOTE — Telephone Encounter (Signed)
Spoke with patient. Advised of message and results as seen below from [place]. She is agreeable and verbalizes understanding. Patient is asking why Dr.Silva is recommending Actonel over Fosamax? Asking if this is due to how frequently she will need to take the medication or if it is for other reasons. She would like me to speak with Dr.Silva before she starts on a medication. Advised I will speak with Dr.Silva upon her return to the office tomorrow and return call. She is agreeable.

## 2016-01-28 NOTE — Telephone Encounter (Signed)
Please contact patient regarding osteoporosis tx as recommended.  I am out of the office all day today.   Let me know if she wishes to use a different medication or frequency of taking the medication.

## 2016-01-28 NOTE — Telephone Encounter (Signed)
Patient is returning a call to [place]. Please call cell [phone]. Patient is aware that Dr.Silva is out of the office today.

## 2016-01-29 MED ORDER — RISEDRONATE SODIUM 150 MG PO TABS: 150.0000 mg | ORAL_TABLET | ORAL | Status: DC

## 2016-01-29 NOTE — Telephone Encounter (Signed)
Spoke with patient. Advised of message as seen below from [place]. Patient is agreeable. She would like to try Actonel at this time. Rx for Actonel 150 mg #12 0RF Take 1 tablet every 30 days with water on an empty stomach, nothing by mouth or lie down for 30 minutes.  Routing to provider for final review. Patient agreeable to disposition. Will close encounter.

## 2016-01-29 NOTE — Telephone Encounter (Signed)
Left message to call Kaitlyn at [phone]. 

## 2016-02-03 ENCOUNTER — Other Ambulatory Visit: Payer: Self-pay | Admitting: Obstetrics and Gynecology

## 2016-02-03 ENCOUNTER — Telehealth: Payer: Self-pay | Admitting: Obstetrics and Gynecology

## 2016-02-03 MED ORDER — ALENDRONATE SODIUM 70 MG PO TABS: 70.0000 mg | ORAL_TABLET | ORAL | Status: DC

## 2016-02-03 NOTE — Telephone Encounter (Signed)
Dr.Silva, patient would like to take Fosamax instead of Actonel. Okay to send in Fosamax 70 mg Take 1 tablet by mouth once a week. Take with a full glass of water on an empty stomach #12 3RF?

## 2016-02-03 NOTE — Telephone Encounter (Signed)
Rx changed to Fosamax 70 mg weekly.  #12, RF 3. I sent this in to patient's pharmacy.

## 2016-02-03 NOTE — Telephone Encounter (Signed)
Patient called and requested Dr. Quincy Simmonds send in Fosamax instead of Actonel to her pharmacy on file.

## 2016-02-04 ENCOUNTER — Encounter: Payer: Self-pay | Admitting: Obstetrics and Gynecology

## 2016-02-04 NOTE — Telephone Encounter (Signed)
Spoke with patient. Advised rx for Fosamax 70 mg weekly. #12 3RF has been sent to her pharmacy on file. She is agreeable and verbalizes understanding.  Routing to provider for final review. Patient agreeable to disposition. Will close encounter.

## 2016-02-20 NOTE — Progress Notes (Signed)
60 y.o. G0P0 Single Caucasian female here for annual exam.    New diagnosis of osteoporosis.  Nauseated for 3 days after taking the first dose of Fosamax.  Taking vit D and calcium Citrate.  Exercising.   Brought in a copy of her labs today: TSH 2.05, vit D 37.5, T chol 250, HDL 62, LDL 160, glucose 93.  PCP:   None  Patient's last menstrual period was 10/11/2010.           Sexually active: No. female The current method of family planning is post menopausal status.    Exercising: Yes.    Tennis and walking Smoker:  no  Health Maintenance: Pap:  01-24-13 Neg:Neg HR HPV History of abnormal Pap:  no MMG:  12-24-15 Density C/Neg/BiRads1:[facility] Colonoscopy:  2013.  Dr. Collene Mares.  Will repeat in 7 years.  BMD:   12-24-15  Result  Osteoporosis:[facility] TDaP:  2008 Will do Zostavax at her office.  Gardasil:   N/A HIV:  Will consider doing at her work.  Hep C:  Will consider doing at her work.  Screening Labs:  Hb today: 13.4 on 11/20/15(thru work), Urine today: thru work   reports that she has never smoked. She has never used smokeless tobacco. She reports that she drinks about 1.8 oz of alcohol per week . She reports that she does not use drugs.  Past Medical History:  Diagnosis Date  . Adenomyosis 11/2008    on PUS  . Anxiety   . Fibroid 11/2008  . Osteoarthritis    hands /rt shoulder  . Osteoporosis July, 2017   BMD at [facility].  T score sping -2.9, total left hip -2.5.  Foxamax started.    Past Surgical History:  Procedure Laterality Date  . TONSILLECTOMY AND ADENOIDECTOMY     age 48    Current Outpatient Prescriptions  Medication Sig Dispense Refill  . alendronate (FOSAMAX) 70 MG tablet Take 1 tablet (70 mg total) by mouth once a week. Take in the am with 6 oz. Water.  Be upright after taking.  Eat nothing for one hour. 12 tablet 3  . IBUPROFEN PO Take by mouth as needed.    Marland Kitchen levothyroxine (SYNTHROID, LEVOTHROID) 50 MCG tablet Take 1 tablet (50 mcg  total) by mouth daily before breakfast. 90 tablet 3  . Omeprazole (PRILOSEC PO) Take by mouth as needed.    . sertraline (ZOLOFT) 50 MG tablet TAKE 1.5 TABLETS BY MOUTH DAILY 135 tablet 3  . triamcinolone cream (KENALOG) 0.5 % Apply 1 application topically as needed.  1  . Vitamin D, Ergocalciferol, (DRISDOL) 50000 units CAPS capsule Take 1 capsule (50,000 Units total) by mouth every 7 (seven) days. Take for 3 weeks every month. 36 capsule 0   No current facility-administered medications for this visit.     Family History  Problem Relation Age of Onset  . Cirrhosis Mother     ROS:  Pertinent items are noted in HPI.  Otherwise, a comprehensive ROS was negative.  Exam:   BP 110/66 (BP Location: Right Arm, Patient Position: Sitting, Cuff Size: Normal)   Pulse 64   Ht 5\' 3"  (1.6 m)   Wt 161 lb 3.2 oz (73.1 kg)   LMP 10/11/2010   BMI 28.56 kg/m     General appearance: alert, cooperative and appears stated age Head: Normocephalic, without obvious abnormality, atraumatic Neck: no adenopathy, supple, symmetrical, trachea midline and thyroid normal to inspection and palpation Lungs: clear to auscultation bilaterally  Breasts: normal appearance, no masses or tenderness, No nipple retraction or dimpling, No nipple discharge or bleeding, No axillary or supraclavicular adenopathy Heart: regular rate and rhythm Abdomen: soft, non-tender; no masses, no organomegaly Extremities: extremities normal, atraumatic, no cyanosis or edema Skin: Skin color, texture, turgor normal. No rashes or lesions Lymph nodes: Cervical, supraclavicular, and axillary nodes normal. No abnormal inguinal nodes palpated Neurologic: Grossly normal  Pelvic: External genitalia:  no lesions              Urethra:  normal appearing urethra with no masses, tenderness or lesions              Bartholins and Skenes: normal                 Vagina: normal appearing vagina with normal color and discharge, no lesions               Cervix: no lesions              Pap taken: Yes.   Bimanual Exam:  Uterus:  normal size, contour, position, consistency, mobility, non-tender              Adnexa: no mass, fullness, tenderness              Rectal exam: Yes.  .  Confirms.              Anus:  normal sphincter tone, no lesions  Chaperone was present for exam.  Assessment:   Well woman visit with normal exam. Osteoporosis. Vit D deficiency. On Vit D 50,000 IU 3 of 4 weeks. Hypothyroidism.  Anxiety.  On Zoloft.   Plan: Yearly mammogram recommended after age 59.  Recommended self breast exam.  Pap and HR HPV as above. Discussed Calcium, Vitamin D, regular exercise program including cardiovascular and weight bearing exercise. Continue Fosamax.  If does not tolerate, will switch to Actonel.  Continue Synthroid.  Continue Zoloft.  Continue vit D.  BMD in 2 years.  Follow up annually and prn.     After visit summary provided.

## 2016-02-21 ENCOUNTER — Encounter: Payer: Self-pay | Admitting: Obstetrics and Gynecology

## 2016-02-21 ENCOUNTER — Ambulatory Visit (INDEPENDENT_AMBULATORY_CARE_PROVIDER_SITE_OTHER): Payer: PRIVATE HEALTH INSURANCE | Admitting: Obstetrics and Gynecology

## 2016-02-21 ENCOUNTER — Other Ambulatory Visit: Payer: Self-pay | Admitting: Obstetrics and Gynecology

## 2016-02-21 VITALS — BP 110/66 | HR 64 | Ht 63.0 in | Wt 161.2 lb

## 2016-02-21 DIAGNOSIS — Z01419 Encounter for gynecological examination (general) (routine) without abnormal findings: Secondary | ICD-10-CM

## 2016-02-21 DIAGNOSIS — M810 Age-related osteoporosis without current pathological fracture: Secondary | ICD-10-CM

## 2016-02-21 MED ORDER — SERTRALINE HCL 50 MG PO TABS: ORAL_TABLET | ORAL | 3 refills | Status: DC

## 2016-02-21 MED ORDER — VITAMIN D (ERGOCALCIFEROL) 1.25 MG (50000 UNIT) PO CAPS: 50000.0000 [IU] | ORAL_CAPSULE | ORAL | 0 refills | Status: DC

## 2016-02-21 MED ORDER — LEVOTHYROXINE SODIUM 50 MCG PO TABS: 50.0000 ug | ORAL_TABLET | Freq: Every day | ORAL | 3 refills | Status: DC

## 2016-02-21 NOTE — Patient Instructions (Signed)
Health Maintenance, Female Adopting a healthy lifestyle and getting preventive care can go a long way to promote health and wellness. Talk with your health care provider about what schedule of regular examinations is right for you. This is a good chance for you to check in with your provider about disease prevention and staying healthy. In between checkups, there are plenty of things you can do on your own. Experts have done a lot of research about which lifestyle changes and preventive measures are most likely to keep you healthy. Ask your health care provider for more information. WEIGHT AND DIET  Eat a healthy diet  Be sure to include plenty of vegetables, fruits, low-fat dairy products, and lean protein.  Do not eat a lot of foods high in solid fats, added sugars, or salt.  Get regular exercise. This is one of the most important things you can do for your health.  Most adults should exercise for at least 150 minutes each week. The exercise should increase your heart rate and make you sweat (moderate-intensity exercise).  Most adults should also do strengthening exercises at least twice a week. This is in addition to the moderate-intensity exercise.  Maintain a healthy weight  Body mass index (BMI) is a measurement that can be used to identify possible weight problems. It estimates body fat based on height and weight. Your health care provider can help determine your BMI and help you achieve or maintain a healthy weight.  For females 20 years of age and older:   A BMI below 18.5 is considered underweight.  A BMI of 18.5 to 24.9 is normal.  A BMI of 25 to 29.9 is considered overweight.  A BMI of 30 and above is considered obese.  Watch levels of cholesterol and blood lipids  You should start having your blood tested for lipids and cholesterol at 60 years of age, then have this test every 5 years.  You may need to have your cholesterol levels checked more often if:  Your lipid  or cholesterol levels are high.  You are older than 60 years of age.  You are at high risk for heart disease.  CANCER SCREENING   Lung Cancer  Lung cancer screening is recommended for adults 55-80 years old who are at high risk for lung cancer because of a history of smoking.  A yearly low-dose CT scan of the lungs is recommended for people who:  Currently smoke.  Have quit within the past 15 years.  Have at least a 30-pack-year history of smoking. A pack year is smoking an average of one pack of cigarettes a day for 1 year.  Yearly screening should continue until it has been 15 years since you quit.  Yearly screening should stop if you develop a health problem that would prevent you from having lung cancer treatment.  Breast Cancer  Practice breast self-awareness. This means understanding how your breasts normally appear and feel.  It also means doing regular breast self-exams. Let your health care provider know about any changes, no matter how small.  If you are in your 20s or 30s, you should have a clinical breast exam (CBE) by a health care provider every 1-3 years as part of a regular health exam.  If you are 40 or older, have a CBE every year. Also consider having a breast X-ray (mammogram) every year.  If you have a family history of breast cancer, talk to your health care provider about genetic screening.  If you   are at high risk for breast cancer, talk to your health care provider about having an MRI and a mammogram every year.  Breast cancer gene (BRCA) assessment is recommended for women who have family members with BRCA-related cancers. BRCA-related cancers include:  Breast.  Ovarian.  Tubal.  Peritoneal cancers.  Results of the assessment will determine the need for genetic counseling and BRCA1 and BRCA2 testing. Cervical Cancer Your health care provider may recommend that you be screened regularly for cancer of the pelvic organs (ovaries, uterus, and  vagina). This screening involves a pelvic examination, including checking for microscopic changes to the surface of your cervix (Pap test). You may be encouraged to have this screening done every 3 years, beginning at age 21.  For women ages 30-65, health care providers may recommend pelvic exams and Pap testing every 3 years, or they may recommend the Pap and pelvic exam, combined with testing for human papilloma virus (HPV), every 5 years. Some types of HPV increase your risk of cervical cancer. Testing for HPV may also be done on women of any age with unclear Pap test results.  Other health care providers may not recommend any screening for nonpregnant women who are considered low risk for pelvic cancer and who do not have symptoms. Ask your health care provider if a screening pelvic exam is right for you.  If you have had past treatment for cervical cancer or a condition that could lead to cancer, you need Pap tests and screening for cancer for at least 20 years after your treatment. If Pap tests have been discontinued, your risk factors (such as having a new sexual partner) need to be reassessed to determine if screening should resume. Some women have medical problems that increase the chance of getting cervical cancer. In these cases, your health care provider may recommend more frequent screening and Pap tests. Colorectal Cancer  This type of cancer can be detected and often prevented.  Routine colorectal cancer screening usually begins at 60 years of age and continues through 60 years of age.  Your health care provider may recommend screening at an earlier age if you have risk factors for colon cancer.  Your health care provider may also recommend using home test kits to check for hidden blood in the stool.  A small camera at the end of a tube can be used to examine your colon directly (sigmoidoscopy or colonoscopy). This is done to check for the earliest forms of colorectal  cancer.  Routine screening usually begins at age 50.  Direct examination of the colon should be repeated every 5-10 years through 60 years of age. However, you may need to be screened more often if early forms of precancerous polyps or small growths are found. Skin Cancer  Check your skin from head to toe regularly.  Tell your health care provider about any new moles or changes in moles, especially if there is a change in a mole's shape or color.  Also tell your health care provider if you have a mole that is larger than the size of a pencil eraser.  Always use sunscreen. Apply sunscreen liberally and repeatedly throughout the day.  Protect yourself by wearing long sleeves, pants, a wide-brimmed hat, and sunglasses whenever you are outside. HEART DISEASE, DIABETES, AND HIGH BLOOD PRESSURE   High blood pressure causes heart disease and increases the risk of stroke. High blood pressure is more likely to develop in:  People who have blood pressure in the high end   of the normal range (130-139/85-89 mm Hg).  People who are overweight or obese.  People who are African American.  If you are 38-23 years of age, have your blood pressure checked every 3-5 years. If you are 61 years of age or older, have your blood pressure checked every year. You should have your blood pressure measured twice--once when you are at a hospital or clinic, and once when you are not at a hospital or clinic. Record the average of the two measurements. To check your blood pressure when you are not at a hospital or clinic, you can use:  An automated blood pressure machine at a pharmacy.  A home blood pressure monitor.  If you are between 45 years and 39 years old, ask your health care provider if you should take aspirin to prevent strokes.  Have regular diabetes screenings. This involves taking a blood sample to check your fasting blood sugar level.  If you are at a normal weight and have a low risk for diabetes,  have this test once every three years after 60 years of age.  If you are overweight and have a high risk for diabetes, consider being tested at a younger age or more often. PREVENTING INFECTION  Hepatitis B  If you have a higher risk for hepatitis B, you should be screened for this virus. You are considered at high risk for hepatitis B if:  You were born in a country where hepatitis B is common. Ask your health care provider which countries are considered high risk.  Your parents were born in a high-risk country, and you have not been immunized against hepatitis B (hepatitis B vaccine).  You have HIV or AIDS.  You use needles to inject street drugs.  You live with someone who has hepatitis B.  You have had sex with someone who has hepatitis B.  You get hemodialysis treatment.  You take certain medicines for conditions, including cancer, organ transplantation, and autoimmune conditions. Hepatitis C  Blood testing is recommended for:  Everyone born from [birth year] through 1965.  Anyone with known risk factors for hepatitis C. Sexually transmitted infections (STIs)  You should be screened for sexually transmitted infections (STIs) including gonorrhea and chlamydia if:  You are sexually active and are younger than 60 years of age.  You are older than 60 years of age and your health care provider tells you that you are at risk for this type of infection.  Your sexual activity has changed since you were last screened and you are at an increased risk for chlamydia or gonorrhea. Ask your health care provider if you are at risk.  If you do not have HIV, but are at risk, it may be recommended that you take a prescription medicine daily to prevent HIV infection. This is called pre-exposure prophylaxis (PrEP). You are considered at risk if:  You are sexually active and do not regularly use condoms or know the HIV status of your partner(s).  You take drugs by injection.  You are sexually  active with a partner who has HIV. Talk with your health care provider about whether you are at high risk of being infected with HIV. If you choose to begin PrEP, you should first be tested for HIV. You should then be tested every 3 months for as long as you are taking PrEP.  PREGNANCY   If you are premenopausal and you may become pregnant, ask your health care provider about preconception counseling.  If you may  become pregnant, take 400 to 800 micrograms (mcg) of folic acid every day.  If you want to prevent pregnancy, talk to your health care provider about birth control (contraception). OSTEOPOROSIS AND MENOPAUSE   Osteoporosis is a disease in which the bones lose minerals and strength with aging. This can result in serious bone fractures. Your risk for osteoporosis can be identified using a bone density scan.  If you are 61 years of age or older, or if you are at risk for osteoporosis and fractures, ask your health care provider if you should be screened.  Ask your health care provider whether you should take a calcium or vitamin D supplement to lower your risk for osteoporosis.  Menopause may have certain physical symptoms and risks.  Hormone replacement therapy may reduce some of these symptoms and risks. Talk to your health care provider about whether hormone replacement therapy is right for you.  HOME CARE INSTRUCTIONS   Schedule regular health, dental, and eye exams.  Stay current with your immunizations.   Do not use any tobacco products including cigarettes, chewing tobacco, or electronic cigarettes.  If you are pregnant, do not drink alcohol.  If you are breastfeeding, limit how much and how often you drink alcohol.  Limit alcohol intake to no more than 1 drink per day for nonpregnant women. One drink equals 12 ounces of beer, 5 ounces of wine, or 1 ounces of hard liquor.  Do not use street drugs.  Do not share needles.  Ask your health care provider for help if  you need support or information about quitting drugs.  Tell your health care provider if you often feel depressed.  Tell your health care provider if you have ever been abused or do not feel safe at home.   This information is not intended to replace advice given to you by your health care provider. Make sure you discuss any questions you have with your health care provider.   Document Released: 01/19/2011 Document Revised: 07/27/2014 Document Reviewed: 06/07/2013 Elsevier Interactive Patient Education [employer].

## 2016-02-26 LAB — IPS PAP TEST WITH HPV

## 2016-05-12 ENCOUNTER — Other Ambulatory Visit: Payer: Self-pay | Admitting: Obstetrics and Gynecology

## 2016-05-12 NOTE — Telephone Encounter (Signed)
Medication refill request: Zoloft  Last AEX: 02/21/16 Dr. Quincy Simmonds   Next AEX: 02/26/17 Dr. Quincy Simmonds  Last MMG (if hormonal medication request): 12/24/15 BIRADS1:Neg  Refill authorized: 02/21/16 #[id number]/ 3R. To [facility] [address]

## 2016-11-17 ENCOUNTER — Other Ambulatory Visit: Payer: Self-pay | Admitting: Obstetrics and Gynecology

## 2016-11-17 DIAGNOSIS — Z1231 Encounter for screening mammogram for malignant neoplasm of breast: Secondary | ICD-10-CM

## 2016-12-18 ENCOUNTER — Other Ambulatory Visit: Payer: Self-pay | Admitting: Gastroenterology

## 2016-12-18 DIAGNOSIS — R110 Nausea: Secondary | ICD-10-CM

## 2016-12-18 DIAGNOSIS — R1013 Epigastric pain: Secondary | ICD-10-CM

## 2016-12-18 DIAGNOSIS — K219 Gastro-esophageal reflux disease without esophagitis: Secondary | ICD-10-CM

## 2016-12-18 HISTORY — DX: Gastro-esophageal reflux disease without esophagitis: K21.9

## 2016-12-19 ENCOUNTER — Other Ambulatory Visit: Payer: Self-pay | Admitting: Obstetrics and Gynecology

## 2016-12-21 NOTE — Telephone Encounter (Signed)
Medication refill request: Vit D Last AEX:  02/21/16 Dr. Quincy Simmonds  Next AEX: 02/26/17 Dr. Quincy Simmonds  Last MMG (if hormonal medication request): 12/24/15 BIRADS1:Neg. Has appt 12/25/16  Refill authorized: 02/21/16 #36caps/0R. Today please advise.

## 2016-12-21 NOTE — Telephone Encounter (Signed)
Please ask Dr. Inda Merlin if we can recheck her vit D level.  She has been on the increased vit D for about one year now.  If she wants to test it at her work and send [place] a copy, that is ok with me! I would hold off on refilling until we check a level if that is acceptable.

## 2016-12-22 ENCOUNTER — Other Ambulatory Visit: Payer: Self-pay | Admitting: Gastroenterology

## 2016-12-22 ENCOUNTER — Ambulatory Visit (HOSPITAL_COMMUNITY)
Admission: RE | Admit: 2016-12-22 | Discharge: 2016-12-22 | Disposition: A | Discharge status: Home / Self Care | Payer: No Typology Code available for payment source | Source: Ambulatory Visit | Attending: Gastroenterology | Admitting: Gastroenterology

## 2016-12-22 DIAGNOSIS — R110 Nausea: Secondary | ICD-10-CM | POA: Diagnosis present

## 2016-12-22 DIAGNOSIS — K8020 Calculus of gallbladder without cholecystitis without obstruction: Secondary | ICD-10-CM | POA: Insufficient documentation

## 2016-12-22 DIAGNOSIS — K839 Disease of biliary tract, unspecified: Secondary | ICD-10-CM | POA: Insufficient documentation

## 2016-12-22 DIAGNOSIS — K838 Other specified diseases of biliary tract: Secondary | ICD-10-CM | POA: Diagnosis present

## 2016-12-22 DIAGNOSIS — R1013 Epigastric pain: Secondary | ICD-10-CM

## 2016-12-22 DIAGNOSIS — R1310 Dysphagia, unspecified: Secondary | ICD-10-CM | POA: Diagnosis present

## 2016-12-22 HISTORY — DX: Calculus of gallbladder without cholecystitis without obstruction: K80.20

## 2016-12-22 NOTE — Telephone Encounter (Signed)
Called Dr. Inda Merlin. She states she will have lab done at work and will send results over.

## 2016-12-23 ENCOUNTER — Ambulatory Visit (HOSPITAL_COMMUNITY)
Admission: RE | Admit: 2016-12-23 | Discharge: 2016-12-23 | Disposition: A | Discharge status: Home / Self Care | Payer: No Typology Code available for payment source | Source: Ambulatory Visit | Attending: Gastroenterology | Admitting: Gastroenterology

## 2016-12-23 DIAGNOSIS — K838 Other specified diseases of biliary tract: Secondary | ICD-10-CM

## 2016-12-24 ENCOUNTER — Encounter: Payer: Self-pay | Admitting: Obstetrics and Gynecology

## 2016-12-25 ENCOUNTER — Ambulatory Visit
Admission: RE | Admit: 2016-12-25 | Discharge: 2016-12-25 | Disposition: A | Discharge status: Home / Self Care | Payer: No Typology Code available for payment source | Source: Ambulatory Visit | Attending: Obstetrics and Gynecology | Admitting: Obstetrics and Gynecology

## 2016-12-25 DIAGNOSIS — Z1231 Encounter for screening mammogram for malignant neoplasm of breast: Secondary | ICD-10-CM

## 2016-12-29 ENCOUNTER — Other Ambulatory Visit: Payer: Self-pay | Admitting: Obstetrics and Gynecology

## 2016-12-29 DIAGNOSIS — R928 Other abnormal and inconclusive findings on diagnostic imaging of breast: Secondary | ICD-10-CM

## 2017-01-01 ENCOUNTER — Ambulatory Visit
Admission: RE | Admit: 2017-01-01 | Discharge: 2017-01-01 | Disposition: A | Discharge status: Home / Self Care | Payer: No Typology Code available for payment source | Source: Ambulatory Visit | Attending: Obstetrics and Gynecology | Admitting: Obstetrics and Gynecology

## 2017-01-01 ENCOUNTER — Ambulatory Visit (HOSPITAL_COMMUNITY): Payer: BLUE CROSS/BLUE SHIELD

## 2017-01-01 ENCOUNTER — Other Ambulatory Visit: Payer: Self-pay | Admitting: Surgery

## 2017-01-01 DIAGNOSIS — R928 Other abnormal and inconclusive findings on diagnostic imaging of breast: Secondary | ICD-10-CM

## 2017-01-14 NOTE — Pre-Procedure Instructions (Signed)
Carla Schmidt  01/14/2017      [facility] [address] [place] [address] Phone: [phone] Fax: [phone]    Your procedure is scheduled on July 2  Report to [facility] at 800 A.M.  Call this number if you have problems the morning of surgery:  [phone]   Remember:  Do not eat food or drink liquids after midnight.  Take these medicines the morning of surgery with A SIP OF WATER tylenol if needed, lansoprazole (Prevacid) if needed, Levothyroxine (Synthroid), Sertraline (Zoloft), Loratadine (Claritin) if needed  Stop taking aspirin, BC's, Goody's, herbal medications, Fish Oil, Aleve, Ibuprofen, Advil, Motrin, vitamins    Do not wear jewelry, make-up or nail polish.  Do not wear lotions, powders, or perfumes, or deoderant.  Do not shave 48 hours prior to surgery.  Men may shave face and neck.  Do not bring valuables to the hospital.  [facility] is not responsible for any belongings or valuables.  Contacts, dentures or bridgework may not be worn into surgery.  Leave your suitcase in the car.  After surgery it may be brought to your room.  For patients admitted to the hospital, discharge time will be determined by your treatment team.  Patients discharged the day of surgery will not be allowed to drive home.   Special instructions:  Carla Schmidt - Preparing for Surgery  Before surgery, you can play an important role.  Because skin is not sterile, your skin needs to be as free of germs as possible.  You can reduce the number of germs on you skin by washing with CHG (chlorahexidine gluconate) soap before surgery.  CHG is an antiseptic cleaner which kills germs and bonds with the skin to continue killing germs even after washing.  Please DO NOT use if you have an allergy to CHG or antibacterial soaps.  If your skin becomes reddened/irritated stop using the CHG and  inform your nurse when you arrive at Short Stay.  Do not shave (including legs and underarms) for at least 48 hours prior to the first CHG shower.  You may shave your face.  Please follow these instructions carefully:   1.  Shower with CHG Soap the night before surgery and the                                morning of Surgery.  2.  If you choose to wash your hair, wash your hair first as usual with your       normal shampoo.  3.  After you shampoo, rinse your hair and body thoroughly to remove the                      Shampoo.  4.  Use CHG as you would any other liquid soap.  You can apply chg directly       to the skin and wash gently with scrungie or a clean washcloth.  5.  Apply the CHG Soap to your body ONLY FROM THE NECK DOWN.        Do not use on open wounds or open sores.  Avoid contact with your eyes,       ears, mouth and genitals (private parts).  Wash genitals (private parts)  with your normal soap.  6.  Wash thoroughly, paying special attention to the area where your surgery        will be performed.  7.  Thoroughly rinse your body with warm water from the neck down.  8.  DO NOT shower/wash with your normal soap after using and rinsing off       the CHG Soap.  9.  Pat yourself dry with a clean towel.            10.  Wear clean pajamas.            11.  Place clean sheets on your bed the night of your first shower and do not        sleep with pets.  Day of Surgery  Do not apply any lotions/deoderants the morning of surgery.  Please wear clean clothes to the hospital/surgery center.     Please read over the following fact sheets that you were given. Pain Booklet, Coughing and Deep Breathing and Surgical Site Infection Prevention

## 2017-01-15 ENCOUNTER — Encounter (HOSPITAL_COMMUNITY)
Admission: RE | Admit: 2017-01-15 | Discharge: 2017-01-15 | Disposition: A | Discharge status: Home / Self Care | Payer: No Typology Code available for payment source | Source: Ambulatory Visit | Attending: Surgery | Admitting: Surgery

## 2017-01-15 ENCOUNTER — Encounter (HOSPITAL_COMMUNITY): Payer: Self-pay

## 2017-01-15 DIAGNOSIS — F419 Anxiety disorder, unspecified: Secondary | ICD-10-CM | POA: Diagnosis not present

## 2017-01-15 DIAGNOSIS — K219 Gastro-esophageal reflux disease without esophagitis: Secondary | ICD-10-CM | POA: Diagnosis not present

## 2017-01-15 DIAGNOSIS — K8010 Calculus of gallbladder with chronic cholecystitis without obstruction: Secondary | ICD-10-CM | POA: Diagnosis present

## 2017-01-15 DIAGNOSIS — E7800 Pure hypercholesterolemia, unspecified: Secondary | ICD-10-CM | POA: Diagnosis not present

## 2017-01-15 DIAGNOSIS — Z79899 Other long term (current) drug therapy: Secondary | ICD-10-CM | POA: Diagnosis not present

## 2017-01-15 HISTORY — DX: Hypothyroidism, unspecified: E03.9

## 2017-01-15 MED ORDER — CHLORHEXIDINE GLUCONATE CLOTH 2 % EX PADS: 6.0000 | MEDICATED_PAD | Freq: Once | CUTANEOUS | Status: DC

## 2017-01-15 NOTE — Progress Notes (Signed)
PCP - Josefa Half  Pt denies cardiac hx, cardiac workup of cardiologist. 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 CBC And CMET placed in paper chart.   No complaints shortness of breath, fever, cough and chest pain at PAT appointment  Patient verbalized understanding of instructions that were given to them at the PAT appointment. Patient was also instructed that they will need to review over the PAT instructions again at home before surgery.

## 2017-01-17 NOTE — H&P (Signed)
Carla Schmidt DR 01/01/2017 9:03 AM Location: [facility] Patient #: [id number] DOB: Dec 14, 1955 Single / Language: Carla Schmidt / Race: White Female   History of Present Illness (Carla Schmidt; 01/01/2017 9:12 AM) Patient words: New-Gallbladder.  The patient is a 61 year old female who presents for evaluation of gall stones. 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 She is referred by Dr. Juanita Schmidt. She has had 2 separate attacks of epigastric abdominal pain with nausea. This prompted an ultrasound showing a gallstone with a mildly prominent bile duct. She is now currently asymptomatic. The pain was sharp and moderate in intensity. It did not refer anywhere else. She denied jaundice. She feels well today. She has been on a low-fat diet. She is otherwise healthy without complaints.   Past Surgical History Carla Schmidt, CMA; 01/01/2017 9:05 AM) Tonsillectomy   Diagnostic Studies History Carla Schmidt, CMA; 01/01/2017 9:05 AM) Colonoscopy  1-5 years ago Mammogram  within last year  Allergies Carla Schmidt, CMA; 01/01/2017 9:05 AM) No Known Drug Allergies 01/01/2017  Medication History (Carla Schmidt, CMA; 01/01/2017 9:05 AM) Levothyroxine Sodium (50MCG Tablet, Oral) Active. Sertraline HCl (50MG  Tablet, Oral) Active. Vitamin D (Ergocalciferol) (50000UNIT Capsule, Oral) Active. Dexilant (60MG  Capsule DR, Oral) Active. Medications Reconciled  Social History Carla Schmidt, CMA; 01/01/2017 9:05 AM) Alcohol use  Occasional alcohol use. Caffeine use  Coffee. No drug use  Tobacco use  Never smoker.  Family History Carla Schmidt, CMA; 01/01/2017 9:05 AM) Alcohol Abuse  Brother, Father, Mother.  Pregnancy / Birth History Carla Schmidt, CMA; 01/01/2017 9:05 AM) Age at menarche  39 years. Age of menopause  40-55 Gravida  0 Para  0  Other Problems Carla Schmidt, CMA; 01/01/2017 9:05 AM) Arthritis   Cholelithiasis  Gastroesophageal Reflux Disease  Hypercholesterolemia     Review of Systems (Carla Schmidt CMA; 01/01/2017 9:05 AM) General Not Present- Appetite Loss, Chills, Fatigue, Fever, Night Sweats, Weight Gain and Weight Loss. Skin Not Present- Change in Wart/Mole, Dryness, Hives, Jaundice, New Lesions, Non-Healing Wounds, Rash and Ulcer. HEENT Present- Wears glasses/contact lenses. Not Present- Earache, Hearing Loss, Hoarseness, Nose Bleed, Oral Ulcers, Ringing in the Ears, Seasonal Allergies, Sinus Pain, Sore Throat, Visual Disturbances and Yellow Eyes. Respiratory Not Present- Bloody sputum, Chronic Cough, Difficulty Breathing, Snoring and Wheezing. Breast Not Present- Breast Mass, Breast Pain, Nipple Discharge and Skin Changes. Cardiovascular Not Present- Chest Pain, Difficulty Breathing Lying Down, Leg Cramps, Palpitations, Rapid Heart Rate, Shortness of Breath and Swelling of Extremities. Gastrointestinal Present- Excessive gas and Indigestion. Not Present- Abdominal Pain, Bloating, Bloody Stool, Change in Bowel Habits, Chronic diarrhea, Constipation, Difficulty Swallowing, Gets full quickly at meals, Hemorrhoids, Nausea, Rectal Pain and Vomiting. Female Genitourinary Not Present- Frequency, Nocturia, Painful Urination, Pelvic Pain and Urgency. Musculoskeletal Present- Joint Pain. Not Present- Back Pain, Joint Stiffness, Muscle Pain, Muscle Weakness and Swelling of Extremities. Psychiatric Not Present- Anxiety, Bipolar, Change in Sleep Pattern, Depression, Fearful and Frequent crying. Endocrine Present- Hot flashes. Not Present- Cold Intolerance, Excessive Hunger, Hair Changes, Heat Intolerance and New Diabetes. Hematology Not Present- Blood Thinners, Easy Bruising, Excessive bleeding, Gland problems, HIV and Persistent Infections.  Vitals (Carla Schmidt CMA; 01/01/2017 9:04 AM) 01/01/2017 9:04 AM Weight: 157.6 lb Height: 63in Body Surface Area: 1.75 m Body Mass Index:  27.92 kg/m  Temp.: 98.50F(Oral)  Pulse: 96 (Regular)  BP: 108/76 (Sitting, Left Arm, Standard)       Physical Exam (Carla Principato A. Ninfa Linden Schmidt; 01/01/2017 9:12 AM) The physical exam findings  are as follows: Note:She is well appearance Lungs are clear to auscultation bilaterally Cardiovascular is regular rate and rhythm Abdomen is soft and nontender with no palpable masses    Assessment & Plan (Carla Pieroni A. Ninfa Linden Schmidt; 01/01/2017 9:13 AM) SYMPTOMATIC CHOLELITHIASIS (K80.20) Impression: I discussed the symptoms with her. I discussed the ultrasound findings. Laparoscopic cholecystectomy is recommended. I discussed the procedure with her in detail including the risks. She understands and wishes to proceed with surgery

## 2017-01-18 ENCOUNTER — Encounter (HOSPITAL_COMMUNITY)
Admission: RE | Disposition: A | Discharge status: Home / Self Care | Payer: Self-pay | Source: Ambulatory Visit | Attending: Surgery

## 2017-01-18 ENCOUNTER — Ambulatory Visit (HOSPITAL_COMMUNITY): Payer: No Typology Code available for payment source

## 2017-01-18 ENCOUNTER — Encounter (HOSPITAL_COMMUNITY): Payer: Self-pay | Admitting: *Deleted

## 2017-01-18 ENCOUNTER — Ambulatory Visit (HOSPITAL_COMMUNITY): Payer: No Typology Code available for payment source | Admitting: Anesthesiology

## 2017-01-18 ENCOUNTER — Ambulatory Visit (HOSPITAL_COMMUNITY)
Admission: RE | Admit: 2017-01-18 | Discharge: 2017-01-18 | Disposition: A | Discharge status: Home / Self Care | Payer: No Typology Code available for payment source | Source: Ambulatory Visit | Attending: Surgery | Admitting: Surgery

## 2017-01-18 DIAGNOSIS — K8010 Calculus of gallbladder with chronic cholecystitis without obstruction: Secondary | ICD-10-CM | POA: Diagnosis not present

## 2017-01-18 DIAGNOSIS — K219 Gastro-esophageal reflux disease without esophagitis: Secondary | ICD-10-CM | POA: Insufficient documentation

## 2017-01-18 DIAGNOSIS — Z419 Encounter for procedure for purposes other than remedying health state, unspecified: Secondary | ICD-10-CM

## 2017-01-18 DIAGNOSIS — E7800 Pure hypercholesterolemia, unspecified: Secondary | ICD-10-CM | POA: Insufficient documentation

## 2017-01-18 DIAGNOSIS — Z79899 Other long term (current) drug therapy: Secondary | ICD-10-CM | POA: Insufficient documentation

## 2017-01-18 DIAGNOSIS — F419 Anxiety disorder, unspecified: Secondary | ICD-10-CM | POA: Insufficient documentation

## 2017-01-18 HISTORY — PX: CHOLECYSTECTOMY: SHX55

## 2017-01-18 SURGERY — LAPAROSCOPIC CHOLECYSTECTOMY WITH INTRAOPERATIVE CHOLANGIOGRAM
Anesthesia: General | Site: Abdomen

## 2017-01-18 MED ORDER — SCOPOLAMINE 1 MG/3DAYS TD PT72
1.0000 | MEDICATED_PATCH | TRANSDERMAL | Status: DC
  Administered 2017-01-18: 1.5 mg via TRANSDERMAL

## 2017-01-18 MED ORDER — LACTATED RINGERS IV SOLN
INTRAVENOUS | Status: DC
  Administered 2017-01-18 (×3): via INTRAVENOUS

## 2017-01-18 MED ORDER — FENTANYL CITRATE (PF) 100 MCG/2ML IJ SOLN
INTRAMUSCULAR | Status: DC | PRN
Start: 2017-01-18 — End: 2017-01-18
  Administered 2017-01-18: 100 ug via INTRAVENOUS

## 2017-01-18 MED ORDER — 0.9 % SODIUM CHLORIDE (POUR BTL) OPTIME
TOPICAL | Status: DC | PRN
  Administered 2017-01-18: 1000 mL

## 2017-01-18 MED ORDER — LIDOCAINE 2% (20 MG/ML) 5 ML SYRINGE
INTRAMUSCULAR | Status: AC
  Filled 2017-01-18: qty 5

## 2017-01-18 MED ORDER — DIPHENHYDRAMINE HCL 50 MG/ML IJ SOLN
INTRAMUSCULAR | Status: AC
  Filled 2017-01-18: qty 1

## 2017-01-18 MED ORDER — ONDANSETRON HCL 4 MG/2ML IJ SOLN
INTRAMUSCULAR | Status: AC
  Filled 2017-01-18: qty 2

## 2017-01-18 MED ORDER — BUPIVACAINE-EPINEPHRINE 0.25% -1:200000 IJ SOLN
INTRAMUSCULAR | Status: DC | PRN
  Administered 2017-01-18: 20 mL

## 2017-01-18 MED ORDER — IOPAMIDOL (ISOVUE-300) INJECTION 61%
INTRAVENOUS | Status: AC
  Filled 2017-01-18: qty 50

## 2017-01-18 MED ORDER — HYDROCODONE-ACETAMINOPHEN 5-325 MG PO TABS: 1.0000 | ORAL_TABLET | ORAL | Status: DC | PRN

## 2017-01-18 MED ORDER — SODIUM CHLORIDE 0.9 % IR SOLN
Status: DC | PRN
  Administered 2017-01-18: 1000 mL

## 2017-01-18 MED ORDER — LIDOCAINE HCL (CARDIAC) 20 MG/ML IV SOLN
INTRAVENOUS | Status: DC | PRN
  Administered 2017-01-18: 60 mg via INTRAVENOUS

## 2017-01-18 MED ORDER — SCOPOLAMINE 1 MG/3DAYS TD PT72: 1.0000 | MEDICATED_PATCH | TRANSDERMAL | Status: DC

## 2017-01-18 MED ORDER — BUPIVACAINE-EPINEPHRINE (PF) 0.25% -1:200000 IJ SOLN
INTRAMUSCULAR | Status: AC
  Filled 2017-01-18: qty 30

## 2017-01-18 MED ORDER — ONDANSETRON HCL 4 MG/2ML IJ SOLN
INTRAMUSCULAR | Status: DC | PRN
  Administered 2017-01-18: 4 mg via INTRAVENOUS

## 2017-01-18 MED ORDER — SUGAMMADEX SODIUM 200 MG/2ML IV SOLN
INTRAVENOUS | Status: AC
  Filled 2017-01-18: qty 2

## 2017-01-18 MED ORDER — PROPOFOL 10 MG/ML IV BOLUS
INTRAVENOUS | Status: DC | PRN
  Administered 2017-01-18: 140 mg via INTRAVENOUS

## 2017-01-18 MED ORDER — MIDAZOLAM HCL 5 MG/5ML IJ SOLN
INTRAMUSCULAR | Status: DC | PRN
  Administered 2017-01-18 (×2): 1 mg via INTRAVENOUS

## 2017-01-18 MED ORDER — DEXTROSE 5 % IV SOLN: INTRAVENOUS | Status: DC | PRN

## 2017-01-18 MED ORDER — DIPHENHYDRAMINE HCL 50 MG/ML IJ SOLN
INTRAMUSCULAR | Status: DC | PRN
  Administered 2017-01-18: 12.5 mg via INTRAVENOUS

## 2017-01-18 MED ORDER — PROMETHAZINE HCL 25 MG/ML IJ SOLN: 6.2500 mg | INTRAMUSCULAR | Status: DC | PRN

## 2017-01-18 MED ORDER — SODIUM CHLORIDE 0.9 % IV SOLN
INTRAVENOUS | Status: DC | PRN
  Administered 2017-01-18: 6 mL

## 2017-01-18 MED ORDER — PHENYLEPHRINE HCL 10 MG/ML IJ SOLN
INTRAMUSCULAR | Status: DC | PRN
  Administered 2017-01-18 (×2): 40 ug via INTRAVENOUS

## 2017-01-18 MED ORDER — ROCURONIUM BROMIDE 10 MG/ML (PF) SYRINGE
PREFILLED_SYRINGE | INTRAVENOUS | Status: AC
  Filled 2017-01-18: qty 5

## 2017-01-18 MED ORDER — KETOROLAC TROMETHAMINE 30 MG/ML IJ SOLN
INTRAMUSCULAR | Status: AC
  Filled 2017-01-18: qty 1

## 2017-01-18 MED ORDER — ROCURONIUM BROMIDE 100 MG/10ML IV SOLN
INTRAVENOUS | Status: DC | PRN
  Administered 2017-01-18: 50 mg via INTRAVENOUS

## 2017-01-18 MED ORDER — SUGAMMADEX SODIUM 200 MG/2ML IV SOLN
INTRAVENOUS | Status: DC | PRN
  Administered 2017-01-18: 120 mg via INTRAVENOUS

## 2017-01-18 MED ORDER — MIDAZOLAM HCL 2 MG/2ML IJ SOLN
INTRAMUSCULAR | Status: AC
  Filled 2017-01-18: qty 2

## 2017-01-18 MED ORDER — FENTANYL CITRATE (PF) 250 MCG/5ML IJ SOLN
INTRAMUSCULAR | Status: AC
  Filled 2017-01-18: qty 5

## 2017-01-18 MED ORDER — CEFAZOLIN SODIUM-DEXTROSE 2-4 GM/100ML-% IV SOLN
2.0000 g | INTRAVENOUS | Status: AC
  Administered 2017-01-18: 2 g via INTRAVENOUS
  Filled 2017-01-18: qty 100

## 2017-01-18 MED ORDER — DEXAMETHASONE SODIUM PHOSPHATE 10 MG/ML IJ SOLN
INTRAMUSCULAR | Status: AC
  Filled 2017-01-18: qty 1

## 2017-01-18 MED ORDER — SCOPOLAMINE 1 MG/3DAYS TD PT72
MEDICATED_PATCH | TRANSDERMAL | Status: AC
  Filled 2017-01-18: qty 1

## 2017-01-18 MED ORDER — HYDROMORPHONE HCL 1 MG/ML IJ SOLN: 0.2500 mg | INTRAMUSCULAR | Status: DC | PRN

## 2017-01-18 MED ORDER — PROPOFOL 10 MG/ML IV BOLUS
INTRAVENOUS | Status: AC
  Filled 2017-01-18: qty 20

## 2017-01-18 MED FILL — HYDROCODON-APAP 5-325: 5-325 | 4 days supply | Qty: 30 | Fill #0

## 2017-01-18 SURGICAL SUPPLY — 37 items
ADH SKN CLS APL DERMABOND .7 (GAUZE/BANDAGES/DRESSINGS) ×1
APPLIER CLIP 5 13 M/L LIGAMAX5 (MISCELLANEOUS) ×3
APR CLP MED LRG 5 ANG JAW (MISCELLANEOUS) ×1
BAG SPEC RTRVL LRG 6X4 10 (ENDOMECHANICALS) ×1
BLADE CLIPPER SURG (BLADE) ×2 IMPLANT
CANISTER SUCT 3000ML PPV (MISCELLANEOUS) ×3 IMPLANT
CHLORAPREP W/TINT 26ML (MISCELLANEOUS) ×3 IMPLANT
CLIP APPLIE 5 13 M/L LIGAMAX5 (MISCELLANEOUS) ×1 IMPLANT
COVER MAYO STAND STRL (DRAPES) ×3 IMPLANT
COVER SURGICAL LIGHT HANDLE (MISCELLANEOUS) ×3 IMPLANT
DERMABOND ADVANCED (GAUZE/BANDAGES/DRESSINGS) ×2
DERMABOND ADVANCED .7 DNX12 (GAUZE/BANDAGES/DRESSINGS) ×1 IMPLANT
DRAPE C-ARM 42X72 X-RAY (DRAPES) ×3 IMPLANT
ELECT REM PT RETURN 9FT ADLT (ELECTROSURGICAL) ×3
ELECTRODE REM PT RTRN 9FT ADLT (ELECTROSURGICAL) ×1 IMPLANT
GLOVE SURG SIGNA 7.5 PF LTX (GLOVE) ×3 IMPLANT
GOWN STRL REUS W/ TWL LRG LVL3 (GOWN DISPOSABLE) ×2 IMPLANT
GOWN STRL REUS W/ TWL XL LVL3 (GOWN DISPOSABLE) ×1 IMPLANT
GOWN STRL REUS W/TWL LRG LVL3 (GOWN DISPOSABLE) ×6
GOWN STRL REUS W/TWL XL LVL3 (GOWN DISPOSABLE) ×3
KIT BASIN OR (CUSTOM PROCEDURE TRAY) ×3 IMPLANT
KIT ROOM TURNOVER OR (KITS) ×3 IMPLANT
NS IRRIG 1000ML POUR BTL (IV SOLUTION) ×3 IMPLANT
PAD ARMBOARD 7.5X6 YLW CONV (MISCELLANEOUS) ×3 IMPLANT
POUCH SPECIMEN RETRIEVAL 10MM (ENDOMECHANICALS) ×3 IMPLANT
SCISSORS LAP 5X35 DISP (ENDOMECHANICALS) ×3 IMPLANT
SET CHOLANGIOGRAPH 5 50 .035 (SET/KITS/TRAYS/PACK) ×3 IMPLANT
SET IRRIG TUBING LAPAROSCOPIC (IRRIGATION / IRRIGATOR) ×3 IMPLANT
SLEEVE ENDOPATH XCEL 5M (ENDOMECHANICALS) ×6 IMPLANT
SPECIMEN JAR SMALL (MISCELLANEOUS) ×3 IMPLANT
SUT MNCRL AB 4-0 PS2 18 (SUTURE) ×3 IMPLANT
TOWEL OR 17X24 6PK STRL BLUE (TOWEL DISPOSABLE) ×3 IMPLANT
TOWEL OR 17X26 10 PK STRL BLUE (TOWEL DISPOSABLE) ×3 IMPLANT
TRAY LAPAROSCOPIC MC (CUSTOM PROCEDURE TRAY) ×3 IMPLANT
TROCAR XCEL BLUNT TIP 100MML (ENDOMECHANICALS) ×3 IMPLANT
TROCAR XCEL NON-BLD 5MMX100MML (ENDOMECHANICALS) ×3 IMPLANT
TUBING INSUFFLATION (TUBING) ×3 IMPLANT

## 2017-01-18 NOTE — Anesthesia Postprocedure Evaluation (Signed)
Anesthesia Post Note  Patient: Carla Schmidt  Procedure(s) Performed: Procedure(s) (LRB): LAPAROSCOPIC CHOLECYSTECTOMY WITH INTRAOPERATIVE CHOLANGIOGRAM (N/A)     Patient location during evaluation: PACU Anesthesia Type: General Level of consciousness: sedated Pain management: pain level controlled Vital Signs Assessment: post-procedure vital signs reviewed and stable Respiratory status: spontaneous breathing and respiratory function stable Cardiovascular status: stable Anesthetic complications: no    Last Vitals:  Vitals:   01/18/17 1116 01/18/17 1131  BP: 118/72 117/68  Pulse: 79 78  Resp: 16 18  Temp:      Last Pain:  Vitals:   01/18/17 1115  TempSrc:   PainSc: 0-No pain                 Ephram Kornegay DANIEL

## 2017-01-18 NOTE — Anesthesia Procedure Notes (Signed)
Procedure Name: Intubation Date/Time: 01/18/2017 9:53 AM Performed by: Izora Gala Pre-anesthesia Checklist: Patient identified, Emergency Drugs available, Suction available and Patient being monitored Patient Re-evaluated:Patient Re-evaluated prior to inductionOxygen Delivery Method: Circle system utilized Preoxygenation: Pre-oxygenation with 100% oxygen Intubation Type: IV induction Ventilation: Mask ventilation without difficulty Laryngoscope Size: Miller and 3 Tube size: 7.0 mm Number of attempts: 1 Airway Equipment and Method: Stylet Placement Confirmation: ETT inserted through vocal cords under direct vision,  positive ETCO2 and breath sounds checked- equal and bilateral Secured at: 0 cm Tube secured with: Tape Dental Injury: Teeth and Oropharynx as per pre-operative assessment

## 2017-01-18 NOTE — Interval H&P Note (Signed)
History and Physical Interval Note:no change in H and P  01/18/2017 9:38 AM  Carla Schmidt  has presented today for surgery, with the diagnosis of symptomatic gallstones  The various methods of treatment have been discussed with the patient and family. After consideration of risks, benefits and other options for treatment, the patient has consented to  Procedure(s): LAPAROSCOPIC CHOLECYSTECTOMY WITH INTRAOPERATIVE CHOLANGIOGRAM (N/A) as a surgical intervention .  The patient's history has been reviewed, patient examined, no change in status, stable for surgery.  I have reviewed the patient's chart and labs.  Questions were answered to the patient's satisfaction.     [name]

## 2017-01-18 NOTE — Anesthesia Preprocedure Evaluation (Signed)
Anesthesia Evaluation  Patient identified by MRN, date of birth, ID band Patient awake    Reviewed: Allergy & Precautions, NPO status , Patient's Chart, lab work & pertinent test results  History of Anesthesia Complications Negative for: history of anesthetic complications  Airway Mallampati: II  TM Distance: >3 FB Neck ROM: Full    Dental no notable dental hx. (+) Dental Advisory Given   Pulmonary neg pulmonary ROS,    Pulmonary exam normal        Cardiovascular negative cardio ROS Normal cardiovascular exam     Neuro/Psych PSYCHIATRIC DISORDERS Anxiety negative neurological ROS     GI/Hepatic Neg liver ROS, GERD  ,  Endo/Other  Hypothyroidism   Renal/GU negative Renal ROS  negative genitourinary   Musculoskeletal negative musculoskeletal ROS (+)   Abdominal   Peds negative pediatric ROS (+)  Hematology negative hematology ROS (+)   Anesthesia Other Findings   Reproductive/Obstetrics negative OB ROS                             Anesthesia Physical Anesthesia Plan  ASA: II  Anesthesia Plan: General   Post-op Pain Management:    Induction: Intravenous  PONV Risk Score and Plan: 4 or greater and Ondansetron, Dexamethasone, Scopolamine patch - Pre-op and Diphenhydramine  Airway Management Planned:   Additional Equipment:   Intra-op Plan:   Post-operative Plan: Extubation in OR  Informed Consent: I have reviewed the patients History and Physical, chart, labs and discussed the procedure including the risks, benefits and alternatives for the proposed anesthesia with the patient or authorized representative who has indicated his/her understanding and acceptance.   Dental advisory given  Plan Discussed with: CRNA, Anesthesiologist and Surgeon  Anesthesia Plan Comments:         Anesthesia Quick Evaluation

## 2017-01-18 NOTE — Discharge Instructions (Signed)
[facility] ______[facility] LAPAROSCOPIC SURGERY: POST OP INSTRUCTIONS Always review your discharge instruction sheet given to you by the facility where your surgery was performed. IF YOU HAVE DISABILITY OR FAMILY LEAVE FORMS, YOU MUST BRING THEM TO THE OFFICE FOR PROCESSING.   DO NOT GIVE THEM TO YOUR DOCTOR.  1. A prescription for pain medication may be given to you upon discharge.  Take your pain medication as prescribed, if needed.  If narcotic pain medicine is not needed, then you may take acetaminophen (Tylenol) or ibuprofen (Advil) as needed. 2. Take your usually prescribed medications unless otherwise directed. 3. If you need a refill on your pain medication, please contact your pharmacy.  They will contact our office to request authorization. Prescriptions will not be filled after 5pm or on week-ends. 4. You should follow a light diet the first few days after arrival home, such as soup and crackers, etc.  Be sure to include lots of fluids daily. 5. Most patients will experience some swelling and bruising in the area of the incisions.  Ice packs will help.  Swelling and bruising can take several days to resolve.  6. It is common to experience some constipation if taking pain medication after surgery.  Increasing fluid intake and taking a stool softener (such as Colace) will usually help or prevent this problem from occurring.  A mild laxative (Milk of Magnesia or Miralax) should be taken according to package instructions if there are no bowel movements after 48 hours. 7. Unless discharge instructions indicate otherwise, you may remove your bandages 24-48 hours after surgery, and you may shower at that time.  You may have steri-strips (small skin tapes) in place directly over the incision.  These strips should be left on the skin for 7-10 days.  If your surgeon used skin glue on the incision, you may shower in 24 hours.  The glue will flake off over the next 2-3 weeks.  Any sutures or  staples will be removed at the office during your follow-up visit. 8. ACTIVITIES:  You may resume regular (light) daily activities beginning the next day--such as daily self-care, walking, climbing stairs--gradually increasing activities as tolerated.  You may have sexual intercourse when it is comfortable.  Refrain from any heavy lifting or straining until approved by your doctor. a. You may drive when you are no longer taking prescription pain medication, you can comfortably wear a seatbelt, and you can safely maneuver your car and apply brakes. b. RETURN TO WORK:  __________________________________________________________ 9. You should see your doctor in the office for a follow-up appointment approximately 2-3 weeks after your surgery.  Make sure that you call for this appointment within a day or two after you arrive home to insure a convenient appointment time. 10. OTHER INSTRUCTIONS:OK TO SHOWER TOMORROW 11. ICE PACK, TYLENOL, IBUPROFEN ALSO FOR PAIN 12. NO LIFTING MORE THAN 15 POUNDS FOR 2 WEEKS __________________________________________________________________________________________________________________________ __________________________________________________________________________________________________________________________ WHEN TO CALL YOUR DOCTOR: 1. Fever over 101.0 2. Inability to urinate 3. Continued bleeding from incision. 4. Increased pain, redness, or drainage from the incision. 5. Increasing abdominal pain  The clinic staff is available to answer your questions during regular business hours.  Please dont hesitate to call and ask to speak to one of the nurses for clinical concerns.  If you have a medical emergency, go to the nearest emergency room or call 911.  A surgeon from [facility] is always on call at the hospital. [address], [place], [place], [place]  [address] ? [address], [place], [place]   [phone] ? [phone] ? FAX  [phone] Web site: [email or web address]

## 2017-01-18 NOTE — Op Note (Signed)
Laparoscopic Cholecystectomy with IOC Procedure Note  Indications: This patient presents with symptomatic gallbladder disease and will undergo laparoscopic cholecystectomy.  Pre-operative Diagnosis: Symptomatic cholelithiasis  Post-operative Diagnosis: Same  Surgeon: Coralie Keens A   Assistants: 0  Anesthesia: General endotracheal anesthesia  ASA Class: 2  Procedure Details  The patient was seen again in the Holding Room. The risks, benefits, complications, treatment options, and expected outcomes were discussed with the patient. The possibilities of reaction to medication, pulmonary aspiration, perforation of viscus, bleeding, recurrent infection, finding a normal gallbladder, the need for additional procedures, failure to diagnose a condition, the possible need to convert to an open procedure, and creating a complication requiring transfusion or operation were discussed with the patient. The likelihood of improving the patient's symptoms with return to their baseline status is good.  The patient and/or family concurred with the proposed plan, giving informed consent. The site of surgery properly noted. The patient was taken to Operating Room, identified as Carla Schmidt and the procedure verified as Laparoscopic Cholecystectomy with Intraoperative Cholangiogram. A Time Out was held and the above information confirmed.  Prior to the induction of general anesthesia, antibiotic prophylaxis was administered. General endotracheal anesthesia was then administered and tolerated well. After the induction, the abdomen was prepped with Chloraprep and draped in the sterile fashion. The patient was positioned in the supine position.  Local anesthetic agent was injected into the skin near the umbilicus and an incision made. We dissected down to the abdominal fascia with blunt dissection.  The fascia was incised vertically and we entered the peritoneal cavity bluntly.  A pursestring suture of 0-Vicryl  was placed around the fascial opening.  The Hasson cannula was inserted and secured with the stay suture.  Pneumoperitoneum was then created with CO2 and tolerated well without any adverse changes in the patient's vital signs. A 5-mm port was placed in the subxiphoid position.  Two 5-mm ports were placed in the right upper quadrant. All skin incisions were infiltrated with a local anesthetic agent before making the incision and placing the trocars.   We positioned the patient in reverse Trendelenburg, tilted slightly to the patient's left.  The gallbladder was identified, the fundus grasped and retracted cephalad. The gallbladder was thick-walled appearance and there were several lesions to the gallbladder. The rest of her intra-abdominal organs that could be visualized were normal in appearance. Her liver was normal in appearance. Adhesions were lysed bluntly and with the electrocautery where indicated, taking care not to injure any adjacent organs or viscus. The infundibulum was grasped and retracted laterally, exposing the peritoneum overlying the triangle of Calot. This was then divided and exposed in a blunt fashion. A critical view of the cystic duct and cystic artery was obtained.  The cystic duct was clearly identified and bluntly dissected circumferentially. The cystic duct was ligated with a clip distally.   An incision was made in the cystic duct and the [facility] cholangiogram catheter introduced. The catheter was secured using a clip. A cholangiogram was then obtained which showed good visualization of the distal and proximal biliary tree with no sign of filling defects or obstruction.  Contrast flowed easily into the duodenum. The catheter was then removed.   The cystic duct was then ligated with clips and divided. The cystic artery was identified, dissected free, ligated with clips and divided as well.   The gallbladder was dissected from the liver bed in retrograde fashion with the electrocautery.  The gallbladder was removed and placed in an  Endocatch sac. The liver bed was irrigated and inspected. Hemostasis was achieved with the electrocautery. Copious irrigation was utilized and was repeatedly aspirated until clear.  The gallbladder and Endocatch sac were then removed through the umbilical port site.  The pursestring suture was used to close the umbilical fascia.    We again inspected the right upper quadrant for hemostasis.  Pneumoperitoneum was released as we removed the trocars.  4-0 Monocryl was used to close the skin.   Dermabond was then  applied. The patient was then extubated and brought to the recovery room in stable condition. Instrument, sponge, and needle counts were correct at closure and at the conclusion of the case.   Findings: Chronic Cholecystitis with Cholelithiasis  Estimated Blood Loss: Minimal         Drains: 0         Specimens: Gallbladder           Complications: None; patient tolerated the procedure well.         Disposition: PACU - hemodynamically stable.         Condition: stable

## 2017-01-18 NOTE — Transfer of Care (Signed)
Immediate Anesthesia Transfer of Care Note  Patient: Carla Schmidt  Procedure(s) Performed: Procedure(s): LAPAROSCOPIC CHOLECYSTECTOMY WITH INTRAOPERATIVE CHOLANGIOGRAM (N/A)  Patient Location: PACU  Anesthesia Type:General  Level of Consciousness: awake, alert , oriented and patient cooperative  Airway & Oxygen Therapy: Patient Spontanous Breathing and Patient connected to nasal cannula oxygen  Post-op Assessment: Report given to RN, Post -op Vital signs reviewed and stable and Patient moving all extremities  Post vital signs: Reviewed and stable  Last Vitals:  Vitals:   01/18/17 0813  BP: 133/73  Pulse: 89  Resp: 20  Temp: 36.7 C    Last Pain:  Vitals:   01/18/17 0813  TempSrc: Oral      Patients Stated Pain Goal: 7 (74/16/38 4536)  Complications: No apparent anesthesia complications

## 2017-01-19 ENCOUNTER — Encounter (HOSPITAL_COMMUNITY): Payer: Self-pay | Admitting: Surgery

## 2017-01-23 ENCOUNTER — Other Ambulatory Visit: Payer: Self-pay | Admitting: Obstetrics and Gynecology

## 2017-01-25 NOTE — Telephone Encounter (Signed)
Medication refill request: Vit D Last AEX:  02/21/16  Next AEX: 02/26/17 Dr. Quincy Simmonds  Last MMG (if hormonal medication request): 01/01/17 BIRADS1:neg  Refill authorized: 02/21/16 #36caps/0R. Today please advise.   Vit D report not in epic

## 2017-01-26 NOTE — Telephone Encounter (Signed)
Please obtain vit D lab result.  I have not received this to date.

## 2017-01-27 NOTE — Telephone Encounter (Signed)
Dr. Inda Merlin states she had Vitamin D levels done at her practice and will bring report in august when she comes to see Dr. Quincy Simmonds. States she has some pills left and will be fine until next appt.  Rx denied.   Dr. Antony Blackbird

## 2017-02-18 ENCOUNTER — Other Ambulatory Visit: Payer: Self-pay | Admitting: Obstetrics and Gynecology

## 2017-02-20 ENCOUNTER — Other Ambulatory Visit: Payer: Self-pay | Admitting: Obstetrics and Gynecology

## 2017-02-22 NOTE — Telephone Encounter (Signed)
Medication refill request: Levothyroxine 23mcg Last AEX:  02/21/16 BS Next AEX: 02/26/17  Last MMG (if hormonal medication request): 01/01/17 BIRADS 1 negative/density c Refill authorized: 02/21/16 #90 w/3 refills; please advise  Medication refill request: Sertraline 50mg  Refill authorized: 02/21/16 #135mg  w/3 refills; today please advise

## 2017-02-26 ENCOUNTER — Ambulatory Visit (INDEPENDENT_AMBULATORY_CARE_PROVIDER_SITE_OTHER): Payer: PRIVATE HEALTH INSURANCE | Admitting: Obstetrics and Gynecology

## 2017-02-26 ENCOUNTER — Encounter: Payer: Self-pay | Admitting: Obstetrics and Gynecology

## 2017-02-26 ENCOUNTER — Other Ambulatory Visit: Payer: Self-pay | Admitting: Obstetrics and Gynecology

## 2017-02-26 VITALS — BP 110/62 | HR 66 | Resp 16 | Ht 63.0 in | Wt 154.0 lb

## 2017-02-26 DIAGNOSIS — Z01419 Encounter for gynecological examination (general) (routine) without abnormal findings: Secondary | ICD-10-CM | POA: Diagnosis not present

## 2017-02-26 MED ORDER — VITAMIN D (ERGOCALCIFEROL) 1.25 MG (50000 UNIT) PO CAPS: 50000.0000 [IU] | ORAL_CAPSULE | ORAL | 3 refills | Status: DC

## 2017-02-26 MED ORDER — LEVOTHYROXINE SODIUM 50 MCG PO TABS: ORAL_TABLET | ORAL | 3 refills | Status: DC

## 2017-02-26 MED ORDER — RISEDRONATE SODIUM 150 MG PO TABS: 150.0000 mg | ORAL_TABLET | ORAL | 11 refills | Status: DC

## 2017-02-26 MED ORDER — SERTRALINE HCL 50 MG PO TABS: 75.0000 mg | ORAL_TABLET | Freq: Every day | ORAL | 3 refills | Status: DC

## 2017-02-26 NOTE — Telephone Encounter (Signed)
eScribe request from [facility] for 90-Day supply of RISEDRONATE and Vitamin D Last filled - 02/26/17 Last AEX - 02/26/17 Next AEX - 03/11/17  Vitamin D sent as 90-day supply earlier today. Please advise.

## 2017-02-26 NOTE — Patient Instructions (Signed)

## 2017-02-26 NOTE — Progress Notes (Signed)
61 y.o. G0P0 Single Caucasian female here for annual exam.    Had her gall bladder removed.  Wants me to check her umbilical incision.   Started on Crestor for elevated cholesterol.  Wants to try Actonel for osteoporosis.  Fosamax caused Reflux.   Wants to take Vit D 50,000 IU 3 weeks out of 4.  Normal Vit D recently.  States her level drops when she starts taking OTC vit D.  TSH normal.  Needs refill of Synthroid.  PCP:  None   Patient's last menstrual period was 10/11/2010.           Sexually active: No.  The current method of family planning is post menopausal status.    Exercising: Yes.    Walking and tennis Smoker:  no  Health Maintenance: Pap: 02/21/16 Pap and HR HPV negative    01-24-13 Neg:Neg HR HPV History of abnormal Pap:  no MMG:  01/01/17 Diagnostic MMG Right Breast - BIRADS 1 negative/density c;12-25-16 poss.disortion Rt.Br.rec.further evaluation:TBC.  Due in June 2019. Colonoscopy:  2013.  Dr. Collene Mares.  Will repeat in 7 years BMD:   12/24/15  Result  Osteoporosis: [facility] TDaP:  2008.  Will do in her office.  Gardasil:   N/A HIV: Neg Hep C: 02-18-17 Neg Screening Labs:   Recently done.  Copy to be scanned in.    reports that she has never smoked. She has never used smokeless tobacco. She reports that she drinks about 1.8 oz of alcohol per week . She reports that she does not use drugs.  Past Medical History:  Diagnosis Date  . Adenomyosis 11/2008    on PUS  . Anxiety    claustrophobic   . Elevated LDL cholesterol level 2017   level 160 on 11/20/15  . Fibroid 11/2008  . Gall stone 12/22/2016   8 mm stone in gallbladder by ultrasound  . Gastroesophageal reflux disease without esophagitis 12/18/2016  . Hypothyroidism   . Osteoarthritis    hands /rt shoulder  . Osteoporosis 01/2016   BMD at [facility].  T score sping -2.9, total left hip -2.5.  Foxamax started and stopped due to reflux.    Past Surgical History:  Procedure Laterality Date  .  CHOLECYSTECTOMY N/A 01/18/2017   Procedure: LAPAROSCOPIC CHOLECYSTECTOMY WITH INTRAOPERATIVE CHOLANGIOGRAM;  Surgeon: Coralie Keens, MD;  Location: [place];  Service: General;  Laterality: N/A;  . COLONOSCOPY    . TONSILLECTOMY AND ADENOIDECTOMY     age 58    Current Outpatient Prescriptions  Medication Sig Dispense Refill  . acetaminophen (TYLENOL) 500 MG tablet Take 1,000 mg by mouth every 8 (eight) hours as needed for mild pain or moderate pain.    Marland Kitchen lansoprazole (PREVACID) 30 MG capsule Take 30 mg by mouth daily as needed.    Marland Kitchen levothyroxine (SYNTHROID, LEVOTHROID) 50 MCG tablet TAKE 1 TABLET(50 MCG) BY MOUTH DAILY BEFORE BREAKFAST 90 tablet 0  . loratadine (CLARITIN) 10 MG tablet Take 10 mg by mouth daily as needed for allergies.    . rosuvastatin (CRESTOR) 5 MG tablet Take 1 tablet by mouth daily.  3  . sertraline (ZOLOFT) 50 MG tablet TAKE 1 AND 1/2 TABLETS BY MOUTH DAILY 135 tablet 0  . Vitamin D, Ergocalciferol, (DRISDOL) 50000 units CAPS capsule Take 1 capsule (50,000 Units total) by mouth every 7 (seven) days. Take for 3 weeks every month. 36 capsule 0   No current facility-administered medications for this visit.     Family History  Problem Relation  Age of Onset  . Cirrhosis Mother     ROS:  Pertinent items are noted in HPI.  Otherwise, a comprehensive ROS was negative.  Exam:   BP 110/62 (BP Location: Right Arm, Patient Position: Sitting, Cuff Size: Small)   Pulse 66   Resp 16   Ht 5\' 3"  (1.6 m)   Wt 154 lb (69.9 kg)   LMP 10/11/2010   BMI 27.28 kg/m     General appearance: alert, cooperative and appears stated age Head: Normocephalic, without obvious abnormality, atraumatic Neck: no adenopathy, supple, symmetrical, trachea midline and thyroid normal to inspection and palpation Lungs: clear to auscultation bilaterally Breasts: normal appearance, no masses or tenderness, No nipple retraction or dimpling, No nipple discharge or bleeding, No axillary or  supraclavicular adenopathy Heart: regular rate and rhythm Abdomen: incisions healing well.  Umbilical incision with palpable suture in fascial layer.  Abdomen is soft, non-tender; no masses, no organomegaly Extremities: extremities normal, atraumatic, no cyanosis or edema Skin: Skin color, texture, turgor normal. No rashes or lesions Lymph nodes: Cervical, supraclavicular, and axillary nodes normal. No abnormal inguinal nodes palpated Neurologic: Grossly normal  Pelvic: External genitalia:  no lesions              Urethra:  normal appearing urethra with no masses, tenderness or lesions              Bartholins and Skenes: normal                 Vagina: normal appearing vagina with normal color and discharge, no lesions              Cervix: no lesions              Pap taken: No. Bimanual Exam:  Uterus:  normal size, contour, position, consistency, mobility, non-tender              Adnexa: no mass, fullness, tenderness              Rectal exam: Yes.  .  Confirms.              Anus:  normal sphincter tone, no lesions  Chaperone was present for exam.  Assessment:   Well woman visit with normal exam. Osteoporosis.  Hx vit D deficiency.  Status post lap chole.  Umbilical incision feels normal. Hypothyroidism.   Plan: Mammogram screening discussed. Recommended self breast awareness. Pap and HR HPV as above. Guidelines for Calcium, Vitamin D, regular exercise program including cardiovascular and weight bearing exercise. Refill of Vit D and Synthroid.  Start Actonel 150 mg monthly.  If does not tolerate, will consider Prolia.  BMD in 1 - 2  Years. Follow up annually and prn.    After visit summary provided.

## 2017-03-01 ENCOUNTER — Encounter: Payer: Self-pay | Admitting: Obstetrics and Gynecology

## 2017-05-21 ENCOUNTER — Other Ambulatory Visit: Payer: Self-pay | Admitting: Obstetrics and Gynecology

## 2017-05-21 NOTE — Telephone Encounter (Signed)
Medication refill request: synthroid and sertraline  Last AEX:  02/26/17 Dr. Quincy Simmonds  Next AEX: 03/11/18  Last MMG (if hormonal medication request): 01/01/17 BIRADS1:neg  Refill authorized: synthroid 02/26/17 #90/3R to [facility] [address]  Sertraline 02/26/17 #135tabs/3R. To [facility] [address]

## 2017-12-02 ENCOUNTER — Other Ambulatory Visit: Payer: Self-pay | Admitting: Obstetrics and Gynecology

## 2017-12-02 DIAGNOSIS — Z1231 Encounter for screening mammogram for malignant neoplasm of breast: Secondary | ICD-10-CM

## 2018-01-04 ENCOUNTER — Ambulatory Visit
Admission: RE | Admit: 2018-01-04 | Discharge: 2018-01-04 | Disposition: A | Discharge status: Home / Self Care | Payer: PRIVATE HEALTH INSURANCE | Source: Ambulatory Visit | Attending: Obstetrics and Gynecology | Admitting: Obstetrics and Gynecology

## 2018-01-04 DIAGNOSIS — Z1231 Encounter for screening mammogram for malignant neoplasm of breast: Secondary | ICD-10-CM

## 2018-01-29 IMAGING — US US ABDOMEN COMPLETE
1 series · 14 of 25 positions shown · non-contrast
Comparison: None.

ADDENDUM:
Additional impression:

8 mm mobile gallstone within the gallbladder.  No wall thickening.
CLINICAL DATA: Abdominal pain, epigastric pain
EXAM:
ABDOMEN ULTRASOUND COMPLETE

[Series 1: us abdomen complete · 0.19mm/px · 14 of 112 slices shown]
[im 1/112]
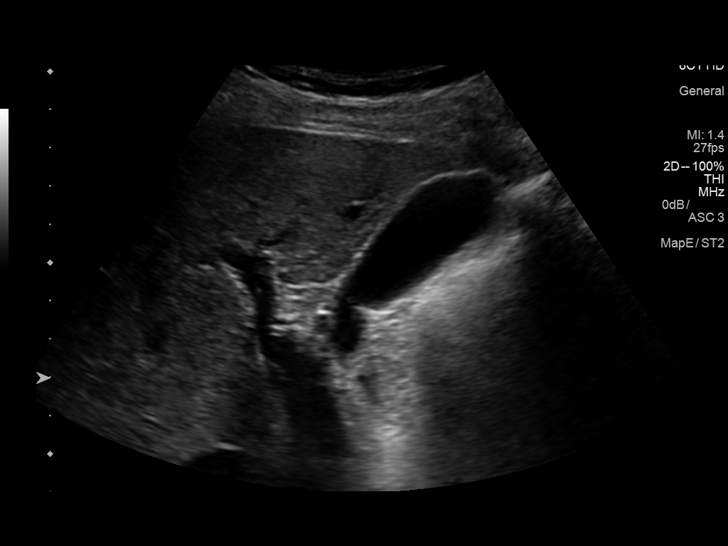
[im 10/112]
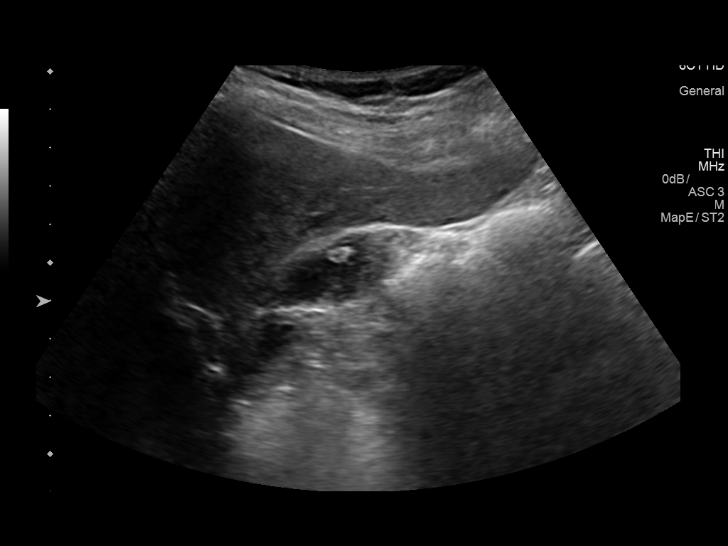
[im 19/112]
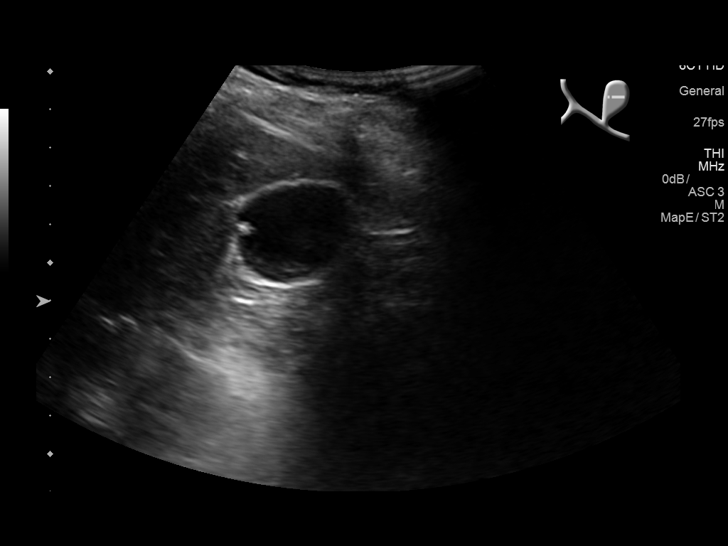
[im 28/112]
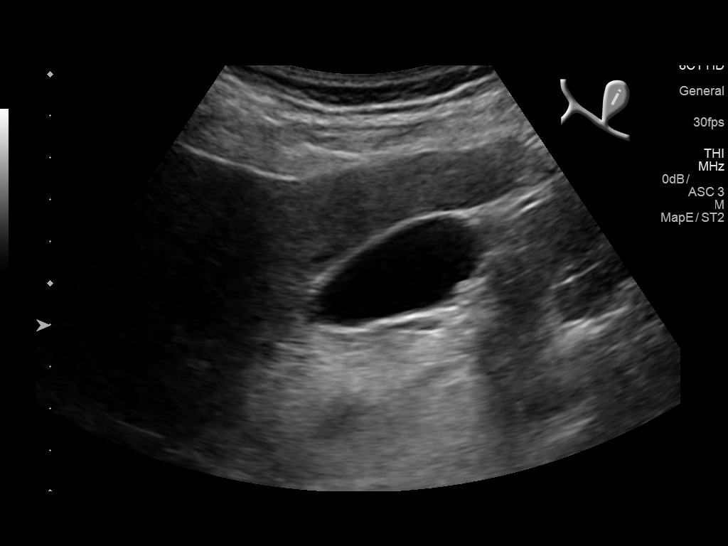
[im 38/112]
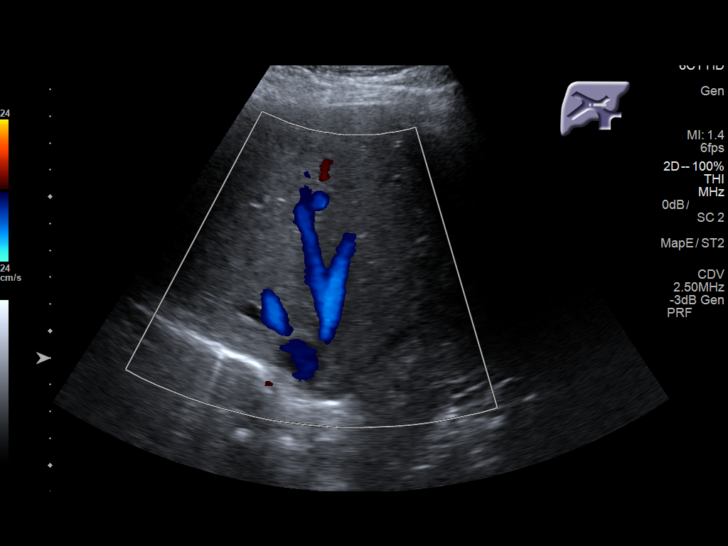
[im 42/112]
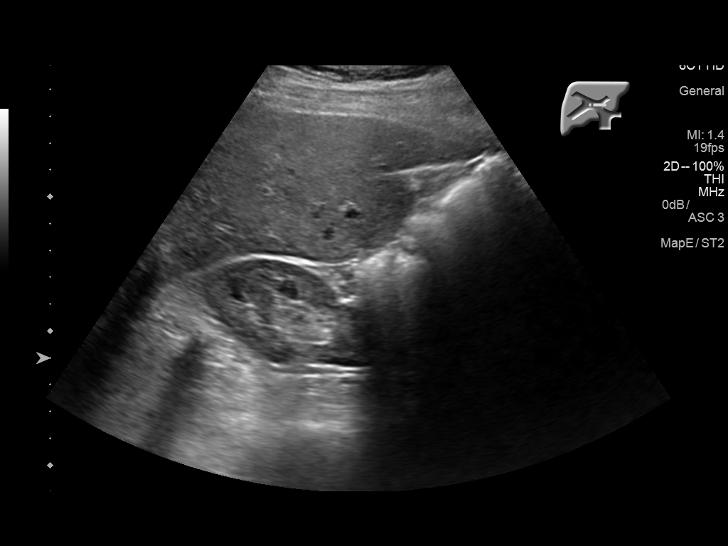
[im 51/112]
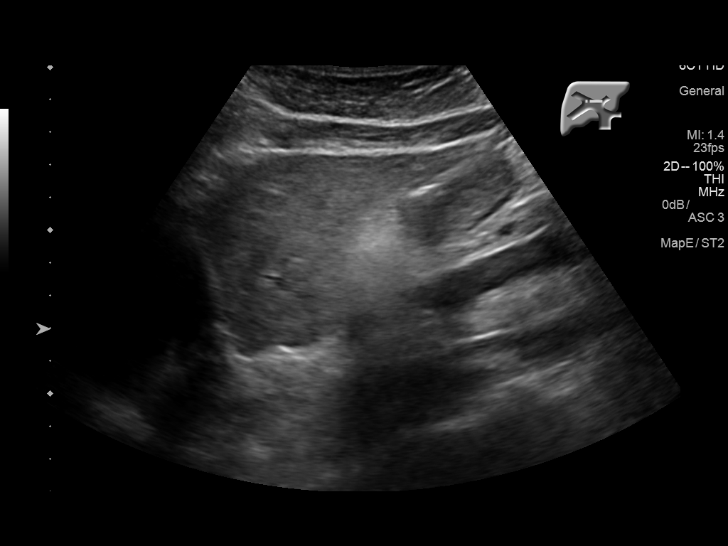
[im 61/112]
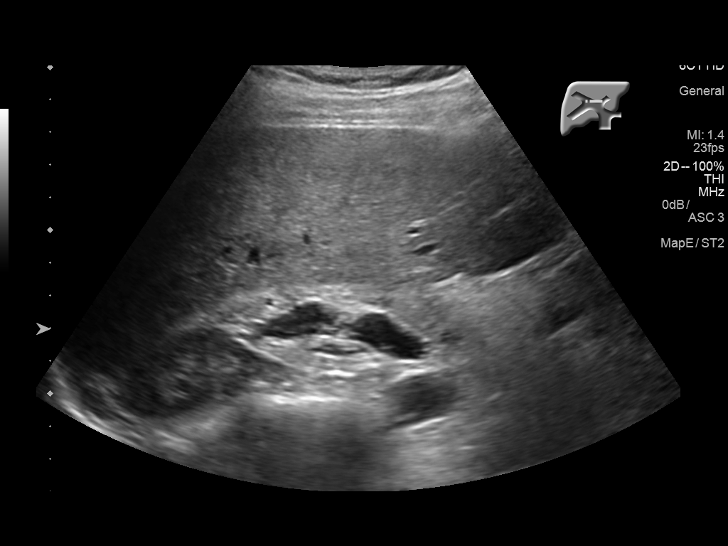
[im 70/112]
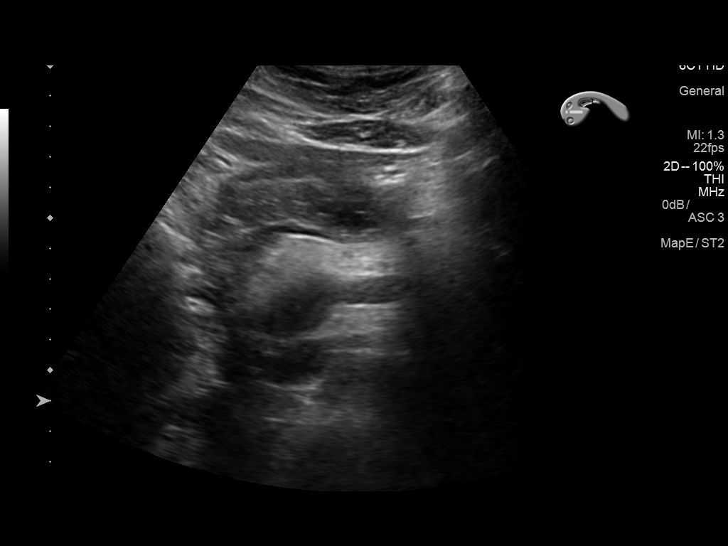
[im 75/112]
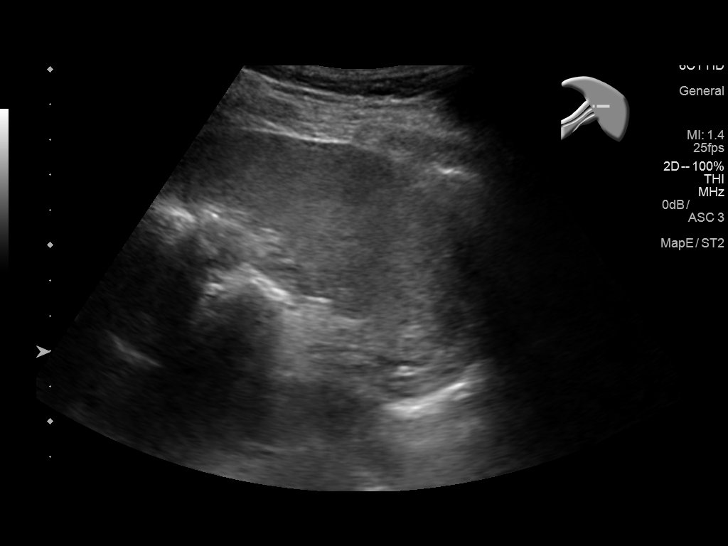
[im 84/112]
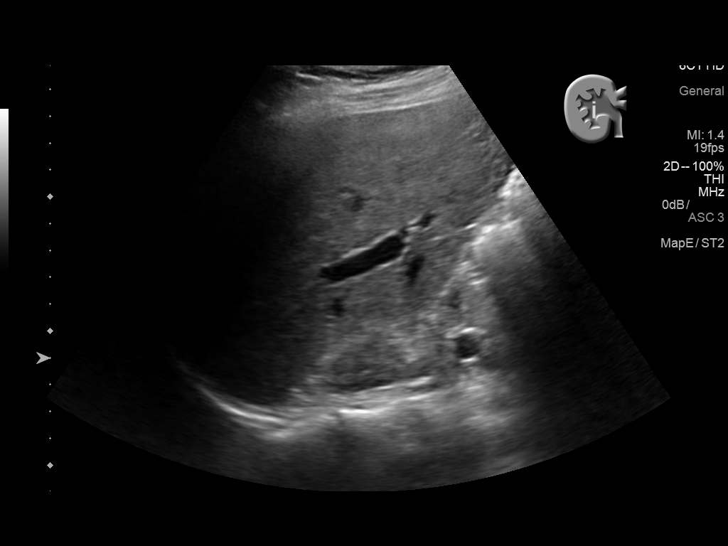
[im 93/112]
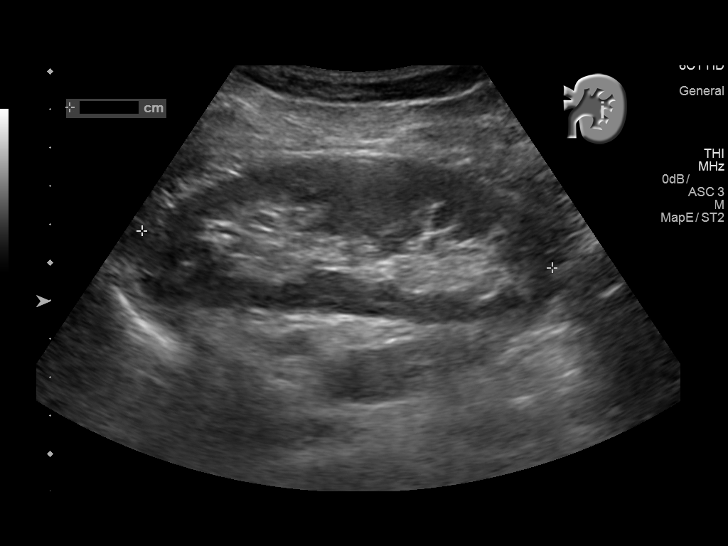
[im 102/112]
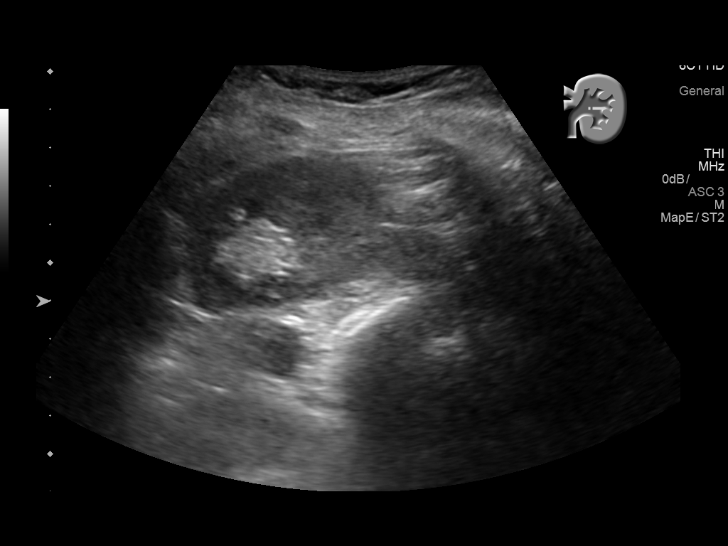
[im 112/112]
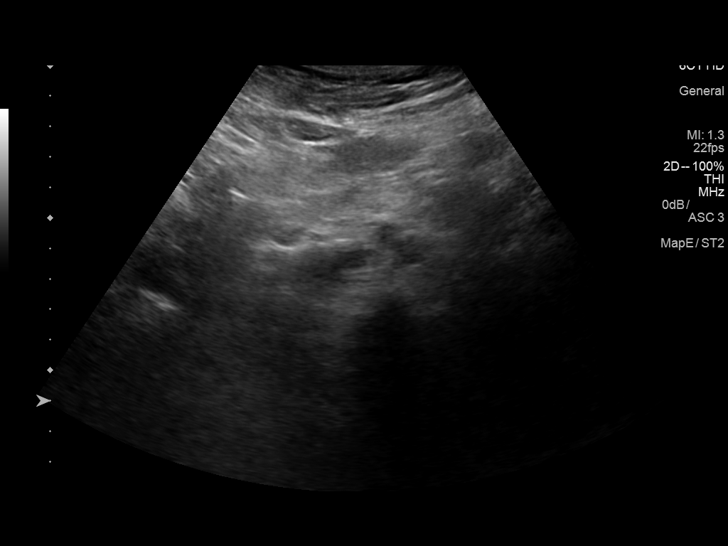

[14 of 25 positions shown; findings below may reference images not displayed]

FINDINGS: Gallbladder: 8 mm mobile gallstone. No wall thickening or
sonographic Murphy's sign.

Common bile duct: Diameter: Dilates in the mid duct, 8-9 mm. The
distal duct cannot be visualized due to overlying bowel gas.

Liver: No focal lesion identified. Within normal limits in
parenchymal echogenicity.

IVC: No abnormality visualized.

Pancreas: Visualized portion unremarkable.

Spleen: Size and appearance within normal limits.

Right Kidney: Length: 10.6 cm. Echogenicity within normal limits. No
mass or hydronephrosis visualized.

Left Kidney: Length: 10.8 cm. Echogenicity within normal limits. No
mass or hydronephrosis visualized.

Abdominal aorta: No aneurysm visualized.

Other findings: None.
IMPRESSION: Mildly prominent common bile duct measuring up to 8 9 mm. Recommend
correlation with LFTs. If there is clinical concern for distal
obstruction, this could be further evaluated with MRCP or ERCP.

## 2018-03-11 ENCOUNTER — Other Ambulatory Visit: Payer: Self-pay | Admitting: *Deleted

## 2018-03-11 ENCOUNTER — Other Ambulatory Visit: Payer: Self-pay

## 2018-03-11 ENCOUNTER — Telehealth: Payer: Self-pay | Admitting: *Deleted

## 2018-03-11 ENCOUNTER — Encounter: Payer: Self-pay | Admitting: Obstetrics and Gynecology

## 2018-03-11 ENCOUNTER — Other Ambulatory Visit (HOSPITAL_COMMUNITY)
Admission: RE | Admit: 2018-03-11 | Discharge: 2018-03-11 | Disposition: A | Discharge status: Home / Self Care | Payer: PRIVATE HEALTH INSURANCE | Source: Ambulatory Visit | Attending: Obstetrics and Gynecology | Admitting: Obstetrics and Gynecology

## 2018-03-11 ENCOUNTER — Ambulatory Visit (INDEPENDENT_AMBULATORY_CARE_PROVIDER_SITE_OTHER): Payer: PRIVATE HEALTH INSURANCE | Admitting: Obstetrics and Gynecology

## 2018-03-11 VITALS — BP 118/60 | HR 84 | Resp 16 | Ht 63.25 in | Wt 157.0 lb

## 2018-03-11 DIAGNOSIS — Z01419 Encounter for gynecological examination (general) (routine) without abnormal findings: Secondary | ICD-10-CM | POA: Insufficient documentation

## 2018-03-11 DIAGNOSIS — M810 Age-related osteoporosis without current pathological fracture: Secondary | ICD-10-CM

## 2018-03-11 MED ORDER — SERTRALINE HCL 50 MG PO TABS: 75.0000 mg | ORAL_TABLET | Freq: Every day | ORAL | 3 refills | Status: DC

## 2018-03-11 MED ORDER — VITAMIN D (ERGOCALCIFEROL) 1.25 MG (50000 UNIT) PO CAPS: 50000.0000 [IU] | ORAL_CAPSULE | ORAL | 3 refills | Status: DC

## 2018-03-11 MED ORDER — LEVOTHYROXINE SODIUM 50 MCG PO TABS: ORAL_TABLET | ORAL | 3 refills | Status: DC

## 2018-03-11 NOTE — Progress Notes (Signed)
62 y.o. G0P0 Single Caucasian female here for annual exam.    Stopped Actonel due to GERD.  Doing Osteostrong program since February.  Taking Vit D 50,000 three weeks per month.   TSH 1.436 - 10/14/17 Tchol - 195, HDL 72, LDL 104 - 10/14/17  Vit D 33.1 - 02/28/18  Retiring and moving to [place].  Just built a house.   PCP: Dr. Lauretta Grill    Patient's last menstrual period was 10/11/2010.           Sexually active: No.  The current method of family planning is post menopausal status.    Exercising: Yes.    tennis and walking Smoker:  no  Health Maintenance: Pap:  02/21/16 Pap and HR HPV negative History of abnormal Pap:  no MMG:  01/04/18 BIRADS 1 negative/density c Colonoscopy:  2013. Dr. Collene Mares. Will repeat in 10 years BMD:  12/24/15  Result  Osteoporosis TDaP:  Patient will get at work Gardasil:   N/a HIV: negative Hep C: negative Screening Labs: patient has copy of labs   reports that she has never smoked. She has never used smokeless tobacco. She reports that she drinks about 3.0 standard drinks of alcohol per week. She reports that she does not use drugs.  Past Medical History:  Diagnosis Date  . Adenomyosis 11/2008    on PUS  . Anxiety    claustrophobic   . Elevated LDL cholesterol level 2017   194 on 10/08/16  . Fibroid 11/2008  . Gall stone 12/22/2016   8 mm stone in gallbladder by ultrasound  . Gastroesophageal reflux disease without esophagitis 12/18/2016  . Hypothyroidism   . Osteoarthritis    hands /rt shoulder  . Osteoporosis 01/2016   BMD at [facility].  T score sping -2.9, total left hip -2.5.  Foxamax started and stopped due to reflux.    Past Surgical History:  Procedure Laterality Date  . CHOLECYSTECTOMY N/A 01/18/2017   Procedure: LAPAROSCOPIC CHOLECYSTECTOMY WITH INTRAOPERATIVE CHOLANGIOGRAM;  Surgeon: Coralie Keens, MD;  Location: [place];  Service: General;  Laterality: N/A;  . COLONOSCOPY    . TONSILLECTOMY AND ADENOIDECTOMY     age 62     Current Outpatient Medications  Medication Sig Dispense Refill  . acetaminophen (TYLENOL) 500 MG tablet Take 1,000 mg by mouth every 8 (eight) hours as needed for mild pain or moderate pain.    Marland Kitchen lansoprazole (PREVACID) 30 MG capsule Take 30 mg by mouth daily as needed.    Marland Kitchen levothyroxine (SYNTHROID, LEVOTHROID) 50 MCG tablet TAKE 1 TABLET(50 MCG) BY MOUTH DAILY BEFORE BREAKFAST 90 tablet 3  . loratadine (CLARITIN) 10 MG tablet Take 10 mg by mouth daily as needed for allergies.    . rosuvastatin (CRESTOR) 5 MG tablet Take 1 tablet by mouth daily. Take 1 tablet 2-3 per week  3  . sertraline (ZOLOFT) 50 MG tablet Take 1.5 tablets (75 mg total) by mouth daily. 135 tablet 3  . Vitamin D, Ergocalciferol, (DRISDOL) 50000 units CAPS capsule TAKE 1 CAPSULE BY MOUTH EVERY 7 DAYS FOR 3 WEEKS EVERY MONTH 9 capsule 3   No current facility-administered medications for this visit.     Family History  Problem Relation Age of Onset  . Cirrhosis Mother   . Breast cancer Neg Hx     Review of Systems  All other systems reviewed and are negative.   Exam:   BP 118/60 (BP Location: Right Arm, Patient Position: Sitting, Cuff Size: Normal)   Pulse 84  Resp 16   Ht 5' 3.25" (1.607 m)   Wt 157 lb (71.2 kg)   LMP 10/11/2010   BMI 27.59 kg/m     General appearance: alert, cooperative and appears stated age Head: Normocephalic, without obvious abnormality, atraumatic Neck: no adenopathy, supple, symmetrical, trachea midline and thyroid normal to inspection and palpation Lungs: clear to auscultation bilaterally Breasts: normal appearance, no masses or tenderness, No nipple retraction or dimpling, No nipple discharge or bleeding, No axillary or supraclavicular adenopathy Heart: regular rate and rhythm Abdomen: soft, non-tender; no masses, no organomegaly Extremities: extremities normal, atraumatic, no cyanosis or edema Skin: Skin color, texture, turgor normal. No rashes or lesions Lymph nodes:  Cervical, supraclavicular, and axillary nodes normal. No abnormal inguinal nodes palpated Neurologic: Grossly normal  Pelvic: External genitalia:  no lesions              Urethra:  normal appearing urethra with no masses, tenderness or lesions              Bartholins and Skenes: normal                 Vagina: normal appearing vagina with normal color and discharge, no lesions              Cervix: no lesions              Pap taken: Yes.   Bimanual Exam:  Uterus:  normal size, contour, position, consistency, mobility, non-tender              Adnexa: no mass, fullness, tenderness              Rectal exam: Yes.  .  Confirms.              Anus:  normal sphincter tone, no lesions  Chaperone was present for exam.  Assessment:   Well woman visit with normal exam. Osteoporosis.  Off Actonel.  GERD. On vit D high dose.  Plan: Mammogram screening. Recommended self breast awareness. Pap and HR HPV as above. Guidelines for Calcium, Vitamin D, regular exercise program including cardiovascular and weight bearing exercise. TDap at her office.  Will order BMD at [facility].  Yearly refills of Vit D 50,000 IU and will do weekly, Zoloft, and Synthroid.  Follow up annually and prn.   After visit summary provided.

## 2018-03-11 NOTE — Telephone Encounter (Signed)
Left message to call Sharee Pimple, RN at [place].   Patient request to schedule BMD at [facility], last BMD at [facility] on 01/07/16. BMD order signed by Dr. Quincy Simmonds and ready to be faxed.

## 2018-03-11 NOTE — Patient Instructions (Signed)

## 2018-03-14 NOTE — Telephone Encounter (Signed)
Patient returned call. Message given to patient as seen below. Patient states she will call [facility] for scheduling.

## 2018-03-14 NOTE — Telephone Encounter (Signed)
Left message to call Mersades Barbaro at [phone].  

## 2018-03-14 NOTE — Telephone Encounter (Signed)
Spoke with Butch Penny. Was advised BMD would have to be ordered by [facility] provider, copy of report can be faxed to Dr. Quincy Simmonds.

## 2018-03-14 NOTE — Telephone Encounter (Signed)
BMD order cancelled.   Routing to Dr. Quincy Simmonds. Encounter closed.

## 2018-03-15 LAB — CYTOLOGY - PAP
DIAGNOSIS: NEGATIVE
HPV (WINDOPATH): NOT DETECTED

## 2018-04-26 ENCOUNTER — Encounter: Payer: Self-pay | Admitting: Obstetrics and Gynecology

## 2018-05-09 ENCOUNTER — Telehealth: Payer: Self-pay | Admitting: Obstetrics and Gynecology

## 2018-05-09 NOTE — Telephone Encounter (Signed)
Please contact Dr. Inda Merlin regarding her BMD from 04/26/18 done at [facility]. She may already have results.  Her BMD is improved, and she now has osteopenia and not osteoporosis.  Her T score went from -2.9 in 2017 to -2.3 in 2019.  Her T score of the left hip went from  -2.5 in 2017 to -2.2 in 2019.   Her FRAX model shows 10 year risk of hip fracture at 1.2% and 10 year risk of major osteoporotic fracture at 9.6%  Keep up the great work.  Weight bearing exercise and her vit D and calcium are working well.  I recommend a repeat BMD in 2 years.

## 2018-05-11 NOTE — Telephone Encounter (Signed)
Call to patient, no answer, left detailed message, ok per dpr. Advised as seen below per Dr. Quincy Simmonds. Advised to return call to office if any additional questions/concerns.   Encounter closed.

## 2018-05-17 ENCOUNTER — Other Ambulatory Visit: Payer: Self-pay | Admitting: Obstetrics and Gynecology

## 2018-07-22 IMAGING — RF DG CHOLANGIOGRAM OPERATIVE
1 series · 4 of 4 positions shown · non-contrast
Comparison: Abdominal ultrasound - 12/22/2016

CLINICAL DATA: Intraoperative cholangiogram during laparoscopic
cholecystectomy.

EXAM:
INTRAOPERATIVE CHOLANGIOGRAM
FLUOROSCOPY TIME:  6 seconds

[Series 1: run · 4 of 21 frames shown]
[frame 4/21]
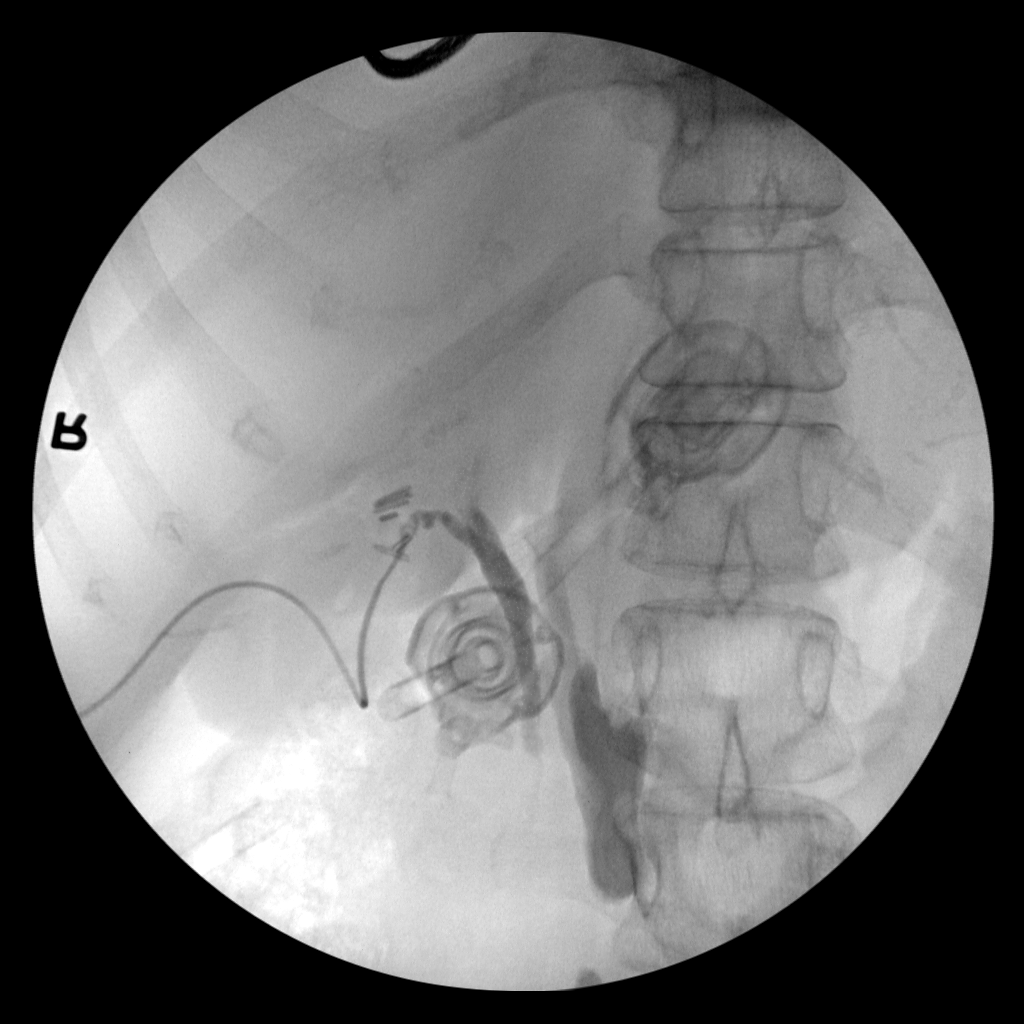
[frame 11/21]
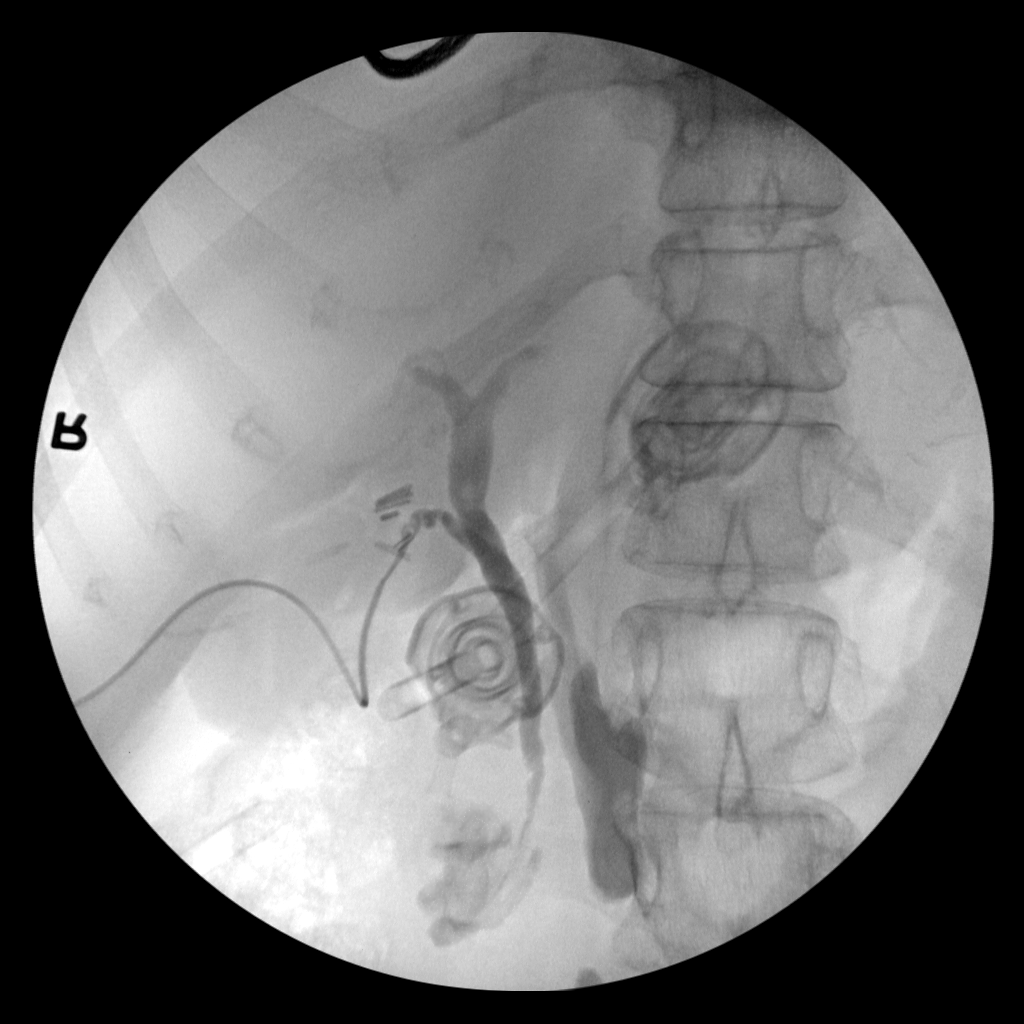
[frame 18/21]
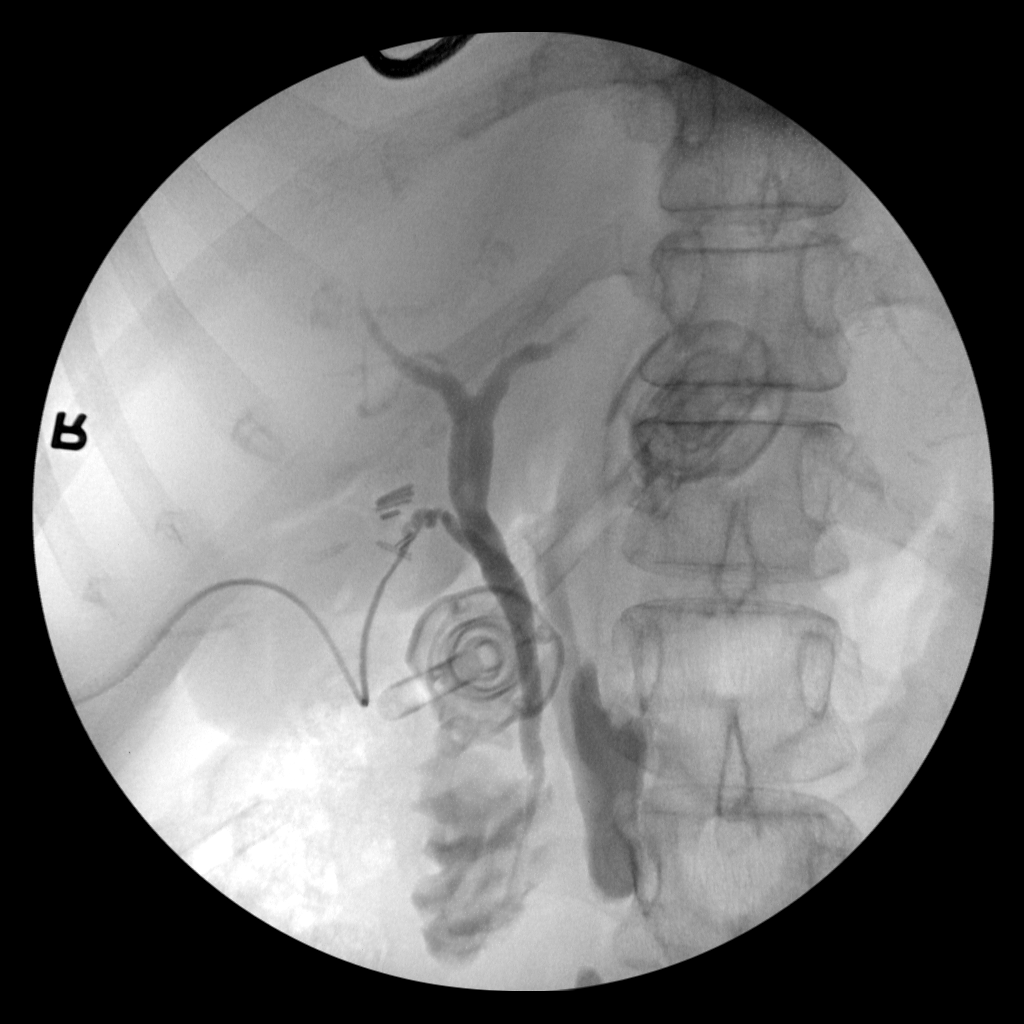
[frame 21/21]
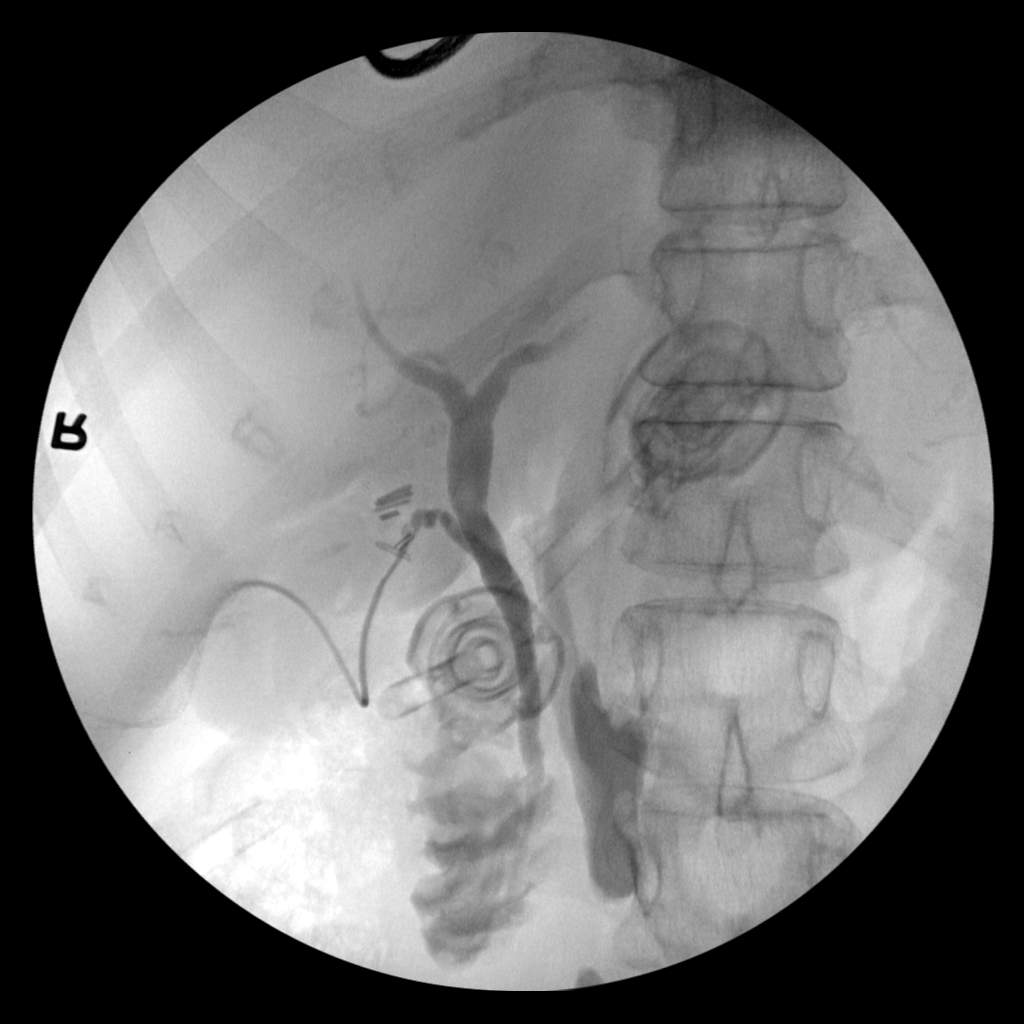

[4 of 4 positions shown; findings below may reference images not displayed]

FINDINGS: Intraoperative cholangiographic images of the right upper abdominal
quadrant during laparoscopic cholecystectomy are provided for
review.

Surgical clips overlie the expected location of the gallbladder
fossa.

Contrast injection demonstrates selective cannulation of the central
aspect of the cystic duct.

There is passage of contrast through the central aspect of the
cystic duct with filling of a non dilated common bile duct. There is
passage of contrast though the CBD and into the descending portion
of the duodenum.

There is minimal reflux of injected contrast into the common hepatic
duct and central aspect of the non dilated intrahepatic biliary
system.

There are no discrete filling defects within the opacified portions
of the biliary system to suggest the presence of
choledocholithiasis.
IMPRESSION: No evidence of choledocholithiasis.

## 2018-11-21 ENCOUNTER — Other Ambulatory Visit: Payer: Self-pay | Admitting: Obstetrics and Gynecology

## 2018-11-21 DIAGNOSIS — Z1231 Encounter for screening mammogram for malignant neoplasm of breast: Secondary | ICD-10-CM

## 2019-01-13 ENCOUNTER — Other Ambulatory Visit: Payer: Self-pay

## 2019-01-13 ENCOUNTER — Ambulatory Visit
Admission: RE | Admit: 2019-01-13 | Discharge: 2019-01-13 | Disposition: A | Discharge status: Home / Self Care | Payer: PRIVATE HEALTH INSURANCE | Source: Ambulatory Visit | Attending: Obstetrics and Gynecology | Admitting: Obstetrics and Gynecology

## 2019-01-13 DIAGNOSIS — Z1231 Encounter for screening mammogram for malignant neoplasm of breast: Secondary | ICD-10-CM

## 2019-03-13 ENCOUNTER — Other Ambulatory Visit: Payer: Self-pay

## 2019-03-13 NOTE — Progress Notes (Signed)
63 y.o. G0P0 Single Caucasian female here for annual exam.    Needs refills of her medications and wants a skin check on her back.   She brings in a copy of her labs done at her own medical office in Dec. 2019.   Retired to [place].   PCP: None  Patient's last menstrual period was 10/11/2010.           Sexually active: No.  The current method of family planning is post menopausal status.    Exercising: Yes.    walks 3 days/week and tennis 2 days/week. Smoker:  no  Health Maintenance: Pap: 03-11-18 Neg:Neg HR HPV, 02-21-16 Neg:Neg HR HPV History of abnormal Pap:  no MMG:  01-13-19 3D Neg/density C/BiRads1 Colonoscopy: 2013 Neg;next 10 years BMD:  04-26-18  Result :Osteopenia TDaP: 06-02-18 Gardasil:   N/A HIV:Neg Hep C:Neg Screening Labs:   --- Shingrix:  Completed.    reports that she has never smoked. She has never used smokeless tobacco. She reports current alcohol use of about 3.0 standard drinks of alcohol per week. She reports that she does not use drugs.  Past Medical History:  Diagnosis Date  . Adenomyosis 11/2008    on PUS  . Anxiety    claustrophobic   . Elevated LDL cholesterol level 2017   194 on 10/08/16  . Fibroid 11/2008  . Gall stone 12/22/2016   8 mm stone in gallbladder by ultrasound  . Gastroesophageal reflux disease without esophagitis 12/18/2016  . Hypothyroidism   . Osteoarthritis    hands /rt shoulder  . Osteoporosis 01/2016   BMD at [facility].  T score sping -2.9, total left hip -2.5.  Foxamax started and stopped due to reflux.    Past Surgical History:  Procedure Laterality Date  . CHOLECYSTECTOMY N/A 01/18/2017   Procedure: LAPAROSCOPIC CHOLECYSTECTOMY WITH INTRAOPERATIVE CHOLANGIOGRAM;  Surgeon: Coralie Keens, MD;  Location: [place];  Service: General;  Laterality: N/A;  . COLONOSCOPY    . TONSILLECTOMY AND ADENOIDECTOMY     age 50    Current Outpatient Medications  Medication Sig Dispense Refill  . acetaminophen (TYLENOL) 500 MG  tablet Take 1,000 mg by mouth every 8 (eight) hours as needed for mild pain or moderate pain.    Marland Kitchen lansoprazole (PREVACID) 30 MG capsule Take 30 mg by mouth daily as needed.    Marland Kitchen levothyroxine (SYNTHROID, LEVOTHROID) 50 MCG tablet TAKE 1 TABLET(50 MCG) BY MOUTH DAILY BEFORE BREAKFAST 90 tablet 3  . loratadine (CLARITIN) 10 MG tablet Take 10 mg by mouth daily as needed for allergies.    . rosuvastatin (CRESTOR) 5 MG tablet Take 1 tablet by mouth daily. Take 1 tablet 2-3 per week  3  . sertraline (ZOLOFT) 50 MG tablet Take 1.5 tablets (75 mg total) by mouth daily. 135 tablet 3  . Vitamin D, Ergocalciferol, (DRISDOL) 50000 units CAPS capsule Take 1 capsule (50,000 Units total) by mouth every 7 (seven) days. 12 capsule 3   No current facility-administered medications for this visit.     Family History  Problem Relation Age of Onset  . Cirrhosis Mother   . Breast cancer Neg Hx     Review of Systems  All other systems reviewed and are negative.   Exam:   LMP 10/11/2010     General appearance: alert, cooperative and appears stated age Head: normocephalic, without obvious abnormality, atraumatic Neck: no adenopathy, supple, symmetrical, trachea midline and thyroid normal to inspection and palpation Lungs: clear to auscultation bilaterally Breasts: normal  appearance, no masses or tenderness, No nipple retraction or dimpling, No nipple discharge or bleeding, No axillary adenopathy Heart: regular rate and rhythm Abdomen: soft, non-tender; no masses, no organomegaly Extremities: extremities normal, atraumatic, no cyanosis or edema Skin: skin color, texture, turgor normal. No rashes or lesions Lymph nodes: cervical, supraclavicular, and axillary nodes normal. Neurologic: grossly normal  Pelvic: External genitalia:  no lesions              No abnormal inguinal nodes palpated.              Urethra:  normal appearing urethra with no masses, tenderness or lesions              Bartholins and  Skenes: normal                 Vagina: normal appearing vagina with normal color and discharge, no lesions              Cervix: no lesions.  Pin point opening of cervix.               Pap taken: No. Bimanual Exam:  Uterus:  normal size, contour, position, consistency, mobility, non-tender              Adnexa: no mass, fullness, tenderness              Rectal exam: Yes.  .  Confirms.              Anus:  normal sphincter tone, no lesions  Chaperone was present for exam.  Assessment:   Well woman visit with normal exam. Osteopenia.  Off Actonel.  GERD. On vit D high dose.  Plan: Mammogram screening discussed. Self breast awareness reviewed. Pap and HR HPV as above. Guidelines for Calcium, Vitamin D, regular exercise program including cardiovascular and weight bearing exercise. Rx for Zoloft, Synthroid, and Vit D. BMD next year.   Will have her labs scanned in to [place]. Follow up annually and prn.   After visit summary provided.

## 2019-03-15 ENCOUNTER — Other Ambulatory Visit: Payer: Self-pay

## 2019-03-15 ENCOUNTER — Ambulatory Visit (INDEPENDENT_AMBULATORY_CARE_PROVIDER_SITE_OTHER): Payer: PRIVATE HEALTH INSURANCE | Admitting: Obstetrics and Gynecology

## 2019-03-15 ENCOUNTER — Encounter: Payer: Self-pay | Admitting: Obstetrics and Gynecology

## 2019-03-15 VITALS — BP 112/60 | HR 70 | Temp 97.9°F | Resp 16 | Ht 63.0 in | Wt 159.4 lb

## 2019-03-15 DIAGNOSIS — Z01419 Encounter for gynecological examination (general) (routine) without abnormal findings: Secondary | ICD-10-CM

## 2019-03-15 MED ORDER — VITAMIN D (ERGOCALCIFEROL) 1.25 MG (50000 UNIT) PO CAPS: 50000.0000 [IU] | ORAL_CAPSULE | ORAL | 3 refills | Status: DC

## 2019-03-15 MED ORDER — SERTRALINE HCL 50 MG PO TABS: 75.0000 mg | ORAL_TABLET | Freq: Every day | ORAL | 3 refills | Status: DC

## 2019-03-15 MED ORDER — LEVOTHYROXINE SODIUM 50 MCG PO TABS: ORAL_TABLET | ORAL | 3 refills | Status: DC

## 2019-03-15 NOTE — Patient Instructions (Signed)

## 2019-12-05 ENCOUNTER — Other Ambulatory Visit: Payer: Self-pay | Admitting: Obstetrics and Gynecology

## 2019-12-05 DIAGNOSIS — Z1231 Encounter for screening mammogram for malignant neoplasm of breast: Secondary | ICD-10-CM

## 2020-01-15 ENCOUNTER — Other Ambulatory Visit: Payer: Self-pay

## 2020-01-15 ENCOUNTER — Ambulatory Visit
Admission: RE | Admit: 2020-01-15 | Discharge: 2020-01-15 | Disposition: A | Discharge status: Home / Self Care | Payer: PRIVATE HEALTH INSURANCE | Source: Ambulatory Visit | Attending: Obstetrics and Gynecology | Admitting: Obstetrics and Gynecology

## 2020-01-15 DIAGNOSIS — Z1231 Encounter for screening mammogram for malignant neoplasm of breast: Secondary | ICD-10-CM

## 2020-03-19 ENCOUNTER — Ambulatory Visit (INDEPENDENT_AMBULATORY_CARE_PROVIDER_SITE_OTHER): Payer: BC Managed Care – PPO | Admitting: Obstetrics and Gynecology

## 2020-03-19 ENCOUNTER — Encounter: Payer: Self-pay | Admitting: Obstetrics and Gynecology

## 2020-03-19 ENCOUNTER — Other Ambulatory Visit: Payer: Self-pay

## 2020-03-19 VITALS — BP 122/70 | HR 70 | Resp 16 | Ht 63.0 in | Wt 160.0 lb

## 2020-03-19 DIAGNOSIS — Z01419 Encounter for gynecological examination (general) (routine) without abnormal findings: Secondary | ICD-10-CM | POA: Diagnosis not present

## 2020-03-19 MED ORDER — LEVOTHYROXINE SODIUM 50 MCG PO TABS: ORAL_TABLET | ORAL | 3 refills | Status: DC

## 2020-03-19 MED ORDER — VITAMIN D (ERGOCALCIFEROL) 1.25 MG (50000 UNIT) PO CAPS
50000.0000 [IU] | ORAL_CAPSULE | ORAL | 0 refills | Status: DC
Start: 2020-03-19 — End: 2021-03-25

## 2020-03-19 MED ORDER — SERTRALINE HCL 50 MG PO TABS: 75.0000 mg | ORAL_TABLET | Freq: Every day | ORAL | 3 refills | Status: DC

## 2020-03-19 NOTE — Patient Instructions (Signed)

## 2020-03-19 NOTE — Progress Notes (Signed)
64 y.o. G0P0 Single Caucasian female here for annual exam.    Some back issues.  Believes she may have a herniated disc. Rest improved her symptoms.   Completed her Covid vaccine, Moderna.   She is taking vit D once a month now.  Brings in a copy of her yearly labs, and these will be scanned in to Epic.  May move back to [place] full time. Enjoying retirement.   PCP: None    Patient's last menstrual period was 10/11/2010.           Sexually active: No.  The current method of family planning is post menopausal status.    Exercising: Yes.    tennis 2x/week and walking Smoker:  no  Health Maintenance: Pap: 03-11-18 Neg:Neg HR HPV, 02-21-16 Neg:Neg HR HPV, 01-24-13 Neg:Neg HR HPV History of abnormal Pap:  no MMG: 01-15-20 3D/Neg/density C/BiRads1 Colonoscopy:  2013 Neg;next 10 years BMD: 04-26-18  Result :Osteopenia TDaP:  06-02-18 Gardasil:   no HIV: Neg in the past Hep C:Neg in the past Screening Labs:  Done. Shingrix;  Completed.    reports that she has never smoked. She has never used smokeless tobacco. She reports current alcohol use of about 5.0 standard drinks of alcohol per week. She reports that she does not use drugs.  Past Medical History:  Diagnosis Date  . Adenomyosis 11/2008    on PUS  . Anxiety    claustrophobic   . Elevated LDL cholesterol level 2017   194 on 10/08/16  . Fibroid 11/2008  . Gall stone 12/22/2016   8 mm stone in gallbladder by ultrasound  . Gastroesophageal reflux disease without esophagitis 12/18/2016  . Hypothyroidism   . Osteoarthritis    hands /rt shoulder  . Osteoporosis 01/2016   BMD at [facility].  T score sping -2.9, total left hip -2.5.  Foxamax started and stopped due to reflux.    Past Surgical History:  Procedure Laterality Date  . CHOLECYSTECTOMY N/A 01/18/2017   Procedure: LAPAROSCOPIC CHOLECYSTECTOMY WITH INTRAOPERATIVE CHOLANGIOGRAM;  Surgeon: Coralie Keens, MD;  Location: [place];  Service: General;  Laterality: N/A;  .  COLONOSCOPY    . TONSILLECTOMY AND ADENOIDECTOMY     age 30    Current Outpatient Medications  Medication Sig Dispense Refill  . acetaminophen (TYLENOL) 500 MG tablet Take 1,000 mg by mouth every 8 (eight) hours as needed for mild pain or moderate pain.    . celecoxib (CELEBREX) 200 MG capsule Take 200 mg by mouth as needed.    . clobetasol cream (TEMOVATE) 0.05 % Apply topically 2 (two) times daily.    . lansoprazole (PREVACID) 30 MG capsule Take 30 mg by mouth daily as needed.    Marland Kitchen levothyroxine (SYNTHROID) 50 MCG tablet TAKE 1 TABLET(50 MCG) BY MOUTH DAILY BEFORE BREAKFAST 90 tablet 3  . loratadine (CLARITIN) 10 MG tablet Take 10 mg by mouth daily as needed for allergies.    Marland Kitchen PREVIDENT 5000 BOOSTER PLUS 1.1 % PSTE See admin instructions.    . rosuvastatin (CRESTOR) 5 MG tablet Take 1 tablet by mouth daily. Take 1 tablet 2-3 per week  3  . sertraline (ZOLOFT) 50 MG tablet Take 1.5 tablets (75 mg total) by mouth daily. 135 tablet 3  . Vitamin D, Ergocalciferol, (DRISDOL) 1.25 MG (50000 UT) CAPS capsule Take 1 capsule (50,000 Units total) by mouth every 7 (seven) days. 12 capsule 3   No current facility-administered medications for this visit.    Family History  Problem  Relation Age of Onset  . Cirrhosis Mother   . Breast cancer Neg Hx     Review of Systems  All other systems reviewed and are negative.   Exam:   BP 122/70   Pulse 70   Resp 16   Ht 5\' 3"  (1.6 m)   Wt 160 lb (72.6 kg)   LMP 10/11/2010   BMI 28.34 kg/m     General appearance: alert, cooperative and appears stated age Head: normocephalic, without obvious abnormality, atraumatic Neck: no adenopathy, supple, symmetrical, trachea midline and thyroid normal to inspection and palpation Lungs: clear to auscultation bilaterally Breasts: normal appearance, no masses or tenderness, No nipple retraction or dimpling, No nipple discharge or bleeding, No axillary adenopathy Heart: regular rate and rhythm Abdomen: soft,  non-tender; no masses, no organomegaly Extremities: extremities normal, atraumatic, no cyanosis or edema Skin: skin color, texture, turgor normal. No rashes or lesions Lymph nodes: cervical, supraclavicular, and axillary nodes normal. Neurologic: grossly normal  Pelvic: External genitalia:  no lesions              No abnormal inguinal nodes palpated.              Urethra:  normal appearing urethra with no masses, tenderness or lesions              Bartholins and Skenes: normal                 Vagina: normal appearing vagina with normal color and discharge, no lesions              Cervix: no lesions              Pap taken: No. Bimanual Exam:  Uterus:  normal size, contour, position, consistency, mobility, non-tender              Adnexa: no mass, fullness, tenderness              Rectal exam: Yes.  .  Confirms.              Anus:  normal sphincter tone, no lesions  Chaperone was present for exam.  Assessment:   Well woman visit with normal exam. Hypothyroidism.  Osteopenia. Off Actonel.  GERD. On vit D high dose.  Plan: Mammogram screening discussed. Self breast awareness reviewed. Pap and HR HPV as above. Guidelines for Calcium, Vitamin D, regular exercise program including cardiovascular and weight bearing exercise. Will do BMD next year.  Continue vit D 50,000 IU monthly.  Refill Synthroid 50 mcg daily.  Refill Zoloft 75 mg daily.  Follow up annually and prn.   After visit summary provided.

## 2020-05-27 DIAGNOSIS — Z23 Encounter for immunization: Secondary | ICD-10-CM | POA: Diagnosis not present

## 2020-07-20 DIAGNOSIS — U071 COVID-19: Secondary | ICD-10-CM

## 2020-07-20 HISTORY — DX: COVID-19: U07.1

## 2020-12-10 ENCOUNTER — Other Ambulatory Visit: Payer: Self-pay | Admitting: Obstetrics and Gynecology

## 2020-12-10 DIAGNOSIS — Z1231 Encounter for screening mammogram for malignant neoplasm of breast: Secondary | ICD-10-CM

## 2020-12-17 DIAGNOSIS — Z1231 Encounter for screening mammogram for malignant neoplasm of breast: Secondary | ICD-10-CM

## 2021-02-04 ENCOUNTER — Other Ambulatory Visit: Payer: Self-pay

## 2021-02-04 ENCOUNTER — Ambulatory Visit
Admission: RE | Admit: 2021-02-04 | Discharge: 2021-02-04 | Disposition: A | Discharge status: Home / Self Care | Payer: 59 | Source: Ambulatory Visit

## 2021-02-04 DIAGNOSIS — Z1231 Encounter for screening mammogram for malignant neoplasm of breast: Secondary | ICD-10-CM

## 2021-03-14 DIAGNOSIS — E7800 Pure hypercholesterolemia, unspecified: Secondary | ICD-10-CM | POA: Diagnosis not present

## 2021-03-14 DIAGNOSIS — E039 Hypothyroidism, unspecified: Secondary | ICD-10-CM | POA: Diagnosis not present

## 2021-03-14 DIAGNOSIS — Z79899 Other long term (current) drug therapy: Secondary | ICD-10-CM | POA: Diagnosis not present

## 2021-03-14 DIAGNOSIS — E559 Vitamin D deficiency, unspecified: Secondary | ICD-10-CM | POA: Diagnosis not present

## 2021-03-25 ENCOUNTER — Other Ambulatory Visit: Payer: Self-pay

## 2021-03-25 ENCOUNTER — Other Ambulatory Visit (HOSPITAL_COMMUNITY)
Admission: RE | Admit: 2021-03-25 | Discharge: 2021-03-25 | Disposition: A | Discharge status: Home / Self Care | Payer: Medicare Other | Source: Ambulatory Visit | Attending: Obstetrics and Gynecology | Admitting: Obstetrics and Gynecology

## 2021-03-25 ENCOUNTER — Encounter: Payer: Self-pay | Admitting: Obstetrics and Gynecology

## 2021-03-25 ENCOUNTER — Ambulatory Visit (INDEPENDENT_AMBULATORY_CARE_PROVIDER_SITE_OTHER): Payer: Medicare Other | Admitting: Obstetrics and Gynecology

## 2021-03-25 VITALS — BP 122/70 | HR 73 | Ht 62.5 in | Wt 160.0 lb

## 2021-03-25 DIAGNOSIS — Z780 Asymptomatic menopausal state: Secondary | ICD-10-CM

## 2021-03-25 DIAGNOSIS — Z1151 Encounter for screening for human papillomavirus (HPV): Secondary | ICD-10-CM | POA: Diagnosis not present

## 2021-03-25 DIAGNOSIS — Z124 Encounter for screening for malignant neoplasm of cervix: Secondary | ICD-10-CM

## 2021-03-25 DIAGNOSIS — M8580 Other specified disorders of bone density and structure, unspecified site: Secondary | ICD-10-CM

## 2021-03-25 DIAGNOSIS — Z01419 Encounter for gynecological examination (general) (routine) without abnormal findings: Secondary | ICD-10-CM | POA: Diagnosis not present

## 2021-03-25 DIAGNOSIS — E039 Hypothyroidism, unspecified: Secondary | ICD-10-CM

## 2021-03-25 DIAGNOSIS — Z8639 Personal history of other endocrine, nutritional and metabolic disease: Secondary | ICD-10-CM

## 2021-03-25 MED ORDER — LEVOTHYROXINE SODIUM 50 MCG PO TABS
ORAL_TABLET | ORAL | 3 refills | Status: DC
Start: 2021-03-25 — End: 2022-03-26

## 2021-03-25 MED ORDER — SERTRALINE HCL 50 MG PO TABS
75.0000 mg | ORAL_TABLET | Freq: Every day | ORAL | 3 refills | Status: DC
Start: 2021-03-25 — End: 2022-03-26

## 2021-03-25 MED ORDER — VITAMIN D (ERGOCALCIFEROL) 1.25 MG (50000 UNIT) PO CAPS: 50000.0000 [IU] | ORAL_CAPSULE | ORAL | 3 refills | Status: DC

## 2021-03-25 NOTE — Progress Notes (Signed)
65 y.o. G0P0 Single Caucasian female here for annual exam.    Some arthritis.   Taking vit D every other week.   Had Covid in January, 2022.   Used to take bisphosphonate for osteoporosis.  Had reflux and stopped.  She had gall bladder issues at the same time.   Will see her PCP in January, 2023.  Had her labs done 03/14/21.  She brings in a copy that will be scanned in to Epic.  CMP normal T chol 191, LDL 99, HDL 75, Chol/HDL 2.5, TG 93.  TSH 2.95 Vit D 40.9  Moved back to [place] from [place].  PCP:  Pamella Pert, MD  Patient's last menstrual period was 10/11/2010.           Sexually active: No.  The current method of family planning is post menopausal status.    Exercising: Yes.     Walks 2x/week and tennis 2x/week Smoker:  no  Health Maintenance: Pap:  03-11-18 Neg:Neg HR HPV, 02-21-16 Neg:Neg HR HPV, 01-24-13 Neg:Neg HR HPV History of abnormal Pap:  no MMG: 02-04-21 3D/Neg/BiRads1 Colonoscopy: 2013 Neg;next 10 years BMD: 04-26-18  Result :Osteopenia TDaP:  06-02-18 Gardasil:   no HIV: Neg in the past Hep C: Neg in the past Screening Labs:  PCP. Shingrix:  completed.   reports that she has never smoked. She has never used smokeless tobacco. She reports current alcohol use of about 5.0 standard drinks per week. She reports that she does not use drugs.  Past Medical History:  Diagnosis Date   Adenomyosis 11/2008    on PUS   Anxiety    claustrophobic    COVID 07/2020   Elevated LDL cholesterol level 2017   194 on 10/08/16   Fibroid 11/2008   Gall stone 12/22/2016   8 mm stone in gallbladder by ultrasound   Gastroesophageal reflux disease without esophagitis 12/18/2016   Hypothyroidism    Osteoarthritis    hands /rt shoulder   Osteoporosis 01/2016   BMD at [facility].  T score sping -2.9, total left hip -2.5.  Foxamax started and stopped due to reflux.    Past Surgical History:  Procedure Laterality Date   CHOLECYSTECTOMY N/A 01/18/2017   Procedure:  LAPAROSCOPIC CHOLECYSTECTOMY WITH INTRAOPERATIVE CHOLANGIOGRAM;  Surgeon: Coralie Keens, MD;  Location: [place];  Service: General;  Laterality: N/A;   COLONOSCOPY     TONSILLECTOMY AND ADENOIDECTOMY     age 72    Current Outpatient Medications  Medication Sig Dispense Refill   acetaminophen (TYLENOL) 500 MG tablet Take 1,000 mg by mouth every 8 (eight) hours as needed for mild pain or moderate pain.     clobetasol cream (TEMOVATE) 0.05 % Apply topically 2 (two) times daily.     lansoprazole (PREVACID) 30 MG capsule Take 30 mg by mouth daily as needed.     levothyroxine (SYNTHROID) 50 MCG tablet TAKE 1 TABLET(50 MCG) BY MOUTH DAILY BEFORE BREAKFAST 90 tablet 3   loratadine (CLARITIN) 10 MG tablet Take 10 mg by mouth daily as needed for allergies.     PREVIDENT 5000 BOOSTER PLUS 1.1 % PSTE See admin instructions.     rosuvastatin (CRESTOR) 5 MG tablet Take 1 tablet by mouth daily. Take 1 tablet 2-3 per week  3   sertraline (ZOLOFT) 50 MG tablet Take 1.5 tablets (75 mg total) by mouth daily. 135 tablet 3   Vitamin D, Ergocalciferol, (DRISDOL) 1.25 MG (50000 UNIT) CAPS capsule Take 1 capsule (50,000 Units total) by mouth every  30 (thirty) days. 12 capsule 0   No current facility-administered medications for this visit.    Family History  Problem Relation Age of Onset   Cirrhosis Mother    Breast cancer Neg Hx     Review of Systems  All other systems reviewed and are negative.  Exam:   BP 122/70   Pulse 73   Ht 5' 2.5" (1.588 m)   Wt 160 lb (72.6 kg)   LMP 10/11/2010   SpO2 97%   BMI 28.80 kg/m     General appearance: alert, cooperative and appears stated age Head: normocephalic, without obvious abnormality, atraumatic Neck: no adenopathy, supple, symmetrical, trachea midline and thyroid normal to inspection and palpation Lungs: clear to auscultation bilaterally Breasts: normal appearance, no masses or tenderness, No nipple retraction or dimpling, No nipple discharge or  bleeding, No axillary adenopathy Heart: regular rate and rhythm Abdomen: soft, non-tender; no masses, no organomegaly Extremities: extremities normal, atraumatic, no cyanosis or edema Skin: skin color, texture, turgor normal. No rashes or lesions Lymph nodes: cervical, supraclavicular, and axillary nodes normal. Neurologic: grossly normal  Pelvic: External genitalia:  no lesions              No abnormal inguinal nodes palpated.              Urethra:  normal appearing urethra with no masses, tenderness or lesions              Bartholins and Skenes: normal                 Vagina: normal appearing vagina with normal color and discharge, no lesions              Cervix: no lesions              Pap taken: Yes.   Bimanual Exam:  Uterus:  normal size, contour, position, consistency, mobility, non-tender              Adnexa: no mass, fullness, tenderness              Rectal exam: yes.  Confirms.              Anus:  normal sphincter tone, no lesions  Chaperone was present for exam:  Estill Bamberg, CMA  Assessment:   Well woman visit with gynecologic exam. Hypothyroidism.  Osteopenia.  Off Actonel.  GERD. Hx vit D deficiency.  Hx anxiety.  On Zoloft. Hyperlipidemia.   Plan: Mammogram screening discussed. Self breast awareness reviewed. Pap and HR HPV as above. Guidelines for Calcium, Vitamin D, regular exercise program including cardiovascular and weight bearing exercise. Refills of Synthroid, Zoloft, and Vit D 50,000 U.  BMD due.  Follow up annually and prn.   After visit summary provided.   30 min  total time was spent for this patient encounter, including preparation, face-to-face counseling with the patient, coordination of care, and documentation of the encounter.

## 2021-03-25 NOTE — Patient Instructions (Signed)

## 2021-03-31 ENCOUNTER — Other Ambulatory Visit: Payer: Self-pay | Admitting: Obstetrics and Gynecology

## 2021-03-31 DIAGNOSIS — E2839 Other primary ovarian failure: Secondary | ICD-10-CM

## 2021-03-31 LAB — CYTOLOGY - PAP
Comment: NEGATIVE
Diagnosis: NEGATIVE
High risk HPV: NEGATIVE

## 2021-04-01 ENCOUNTER — Other Ambulatory Visit: Payer: 59

## 2021-04-02 ENCOUNTER — Ambulatory Visit
Admission: RE | Admit: 2021-04-02 | Discharge: 2021-04-02 | Disposition: A | Discharge status: Home / Self Care | Payer: Medicare Other | Source: Ambulatory Visit | Attending: Obstetrics and Gynecology | Admitting: Obstetrics and Gynecology

## 2021-04-02 ENCOUNTER — Other Ambulatory Visit: Payer: Self-pay

## 2021-04-02 DIAGNOSIS — Z780 Asymptomatic menopausal state: Secondary | ICD-10-CM | POA: Diagnosis not present

## 2021-04-02 DIAGNOSIS — E2839 Other primary ovarian failure: Secondary | ICD-10-CM

## 2021-04-02 DIAGNOSIS — M8589 Other specified disorders of bone density and structure, multiple sites: Secondary | ICD-10-CM | POA: Diagnosis not present

## 2021-04-07 DIAGNOSIS — Z23 Encounter for immunization: Secondary | ICD-10-CM | POA: Diagnosis not present

## 2021-05-08 DIAGNOSIS — H5213 Myopia, bilateral: Secondary | ICD-10-CM | POA: Diagnosis not present

## 2021-05-15 DIAGNOSIS — M7061 Trochanteric bursitis, right hip: Secondary | ICD-10-CM | POA: Diagnosis not present

## 2021-06-26 DIAGNOSIS — M7061 Trochanteric bursitis, right hip: Secondary | ICD-10-CM | POA: Diagnosis not present

## 2021-06-26 DIAGNOSIS — M25551 Pain in right hip: Secondary | ICD-10-CM | POA: Diagnosis not present

## 2021-07-18 IMAGING — MG DIGITAL SCREENING BILAT W/ TOMO W/ CAD
8 series · 8 of 24 positions shown · non-contrast
Comparison: Previous exam(s).

CLINICAL DATA: Screening.

EXAM:
DIGITAL SCREENING BILATERAL MAMMOGRAM WITH TOMO AND CAD

[L MLO synth-2D]
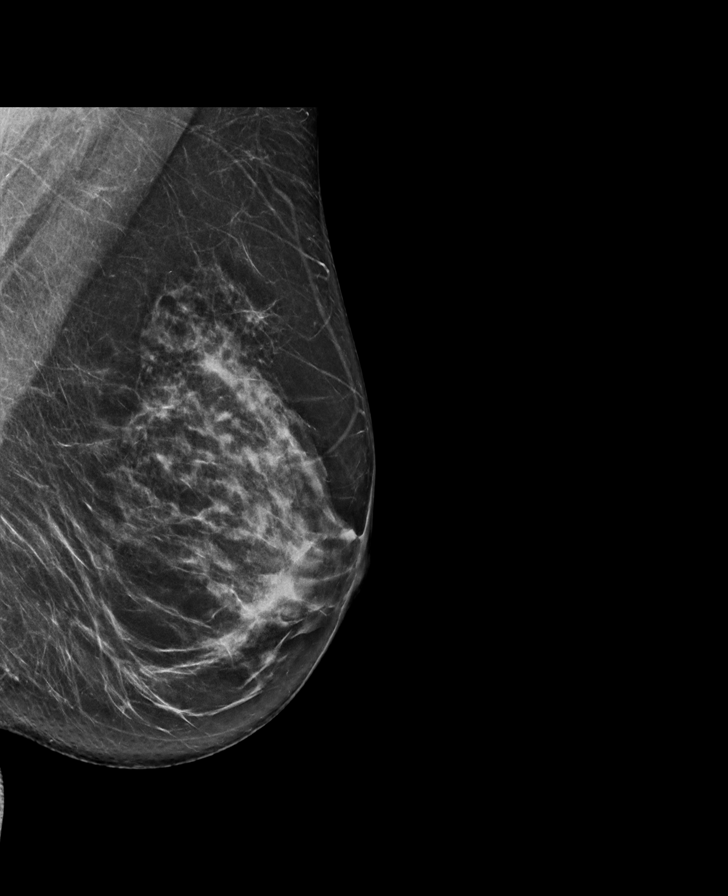

[R MLO synth-2D]
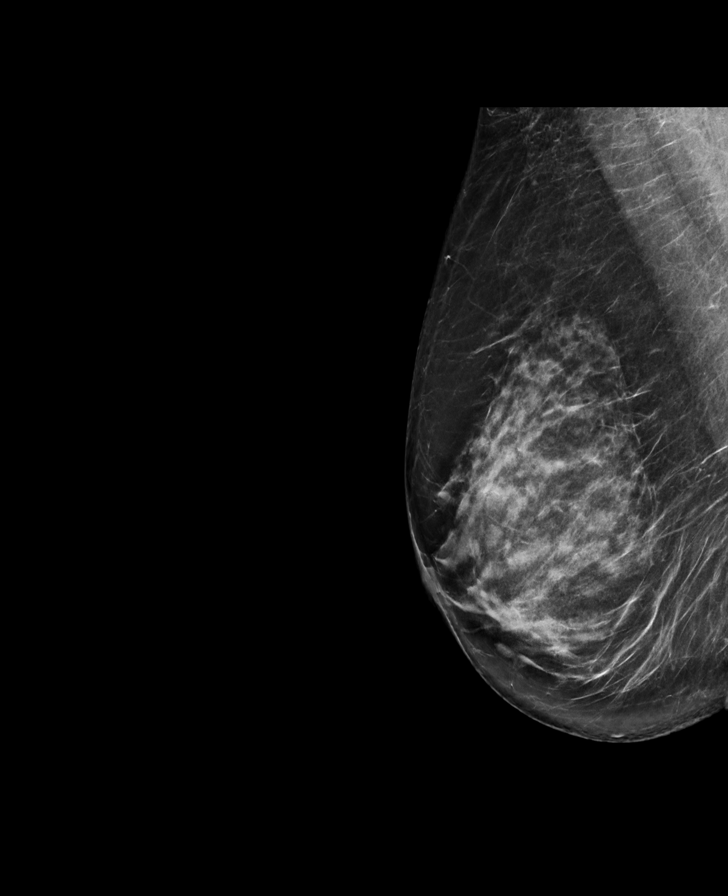

[L CC synth-2D]
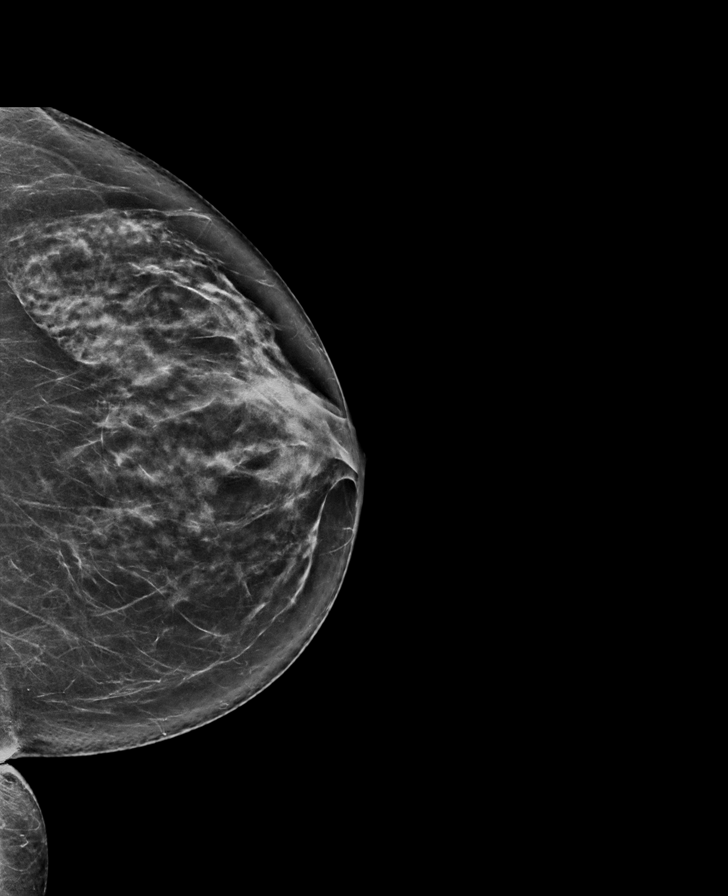

[R CC synth-2D]
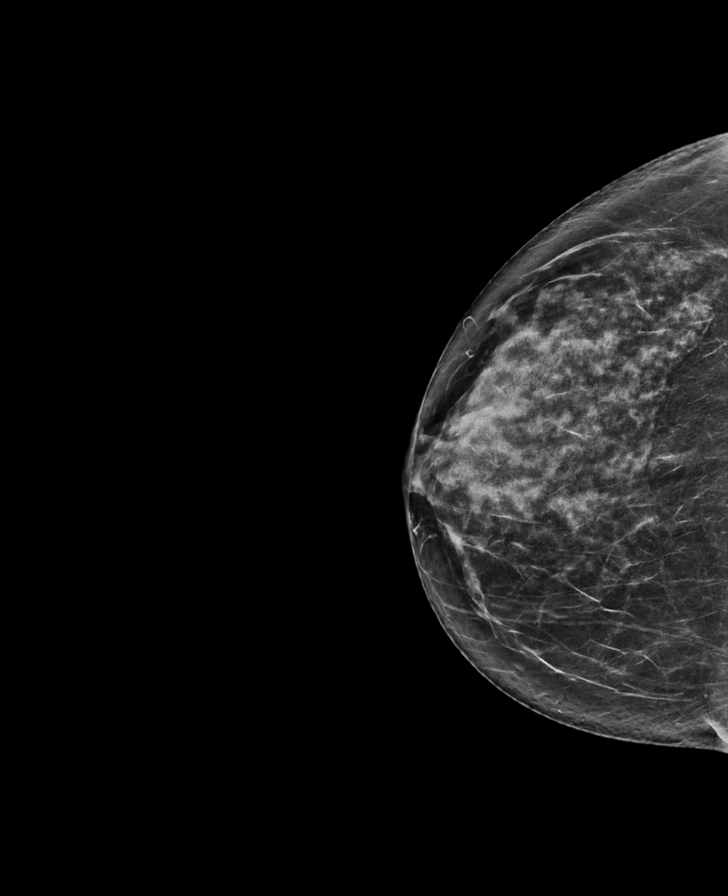

[R MLO tomo · tomo slice 40/79.0]
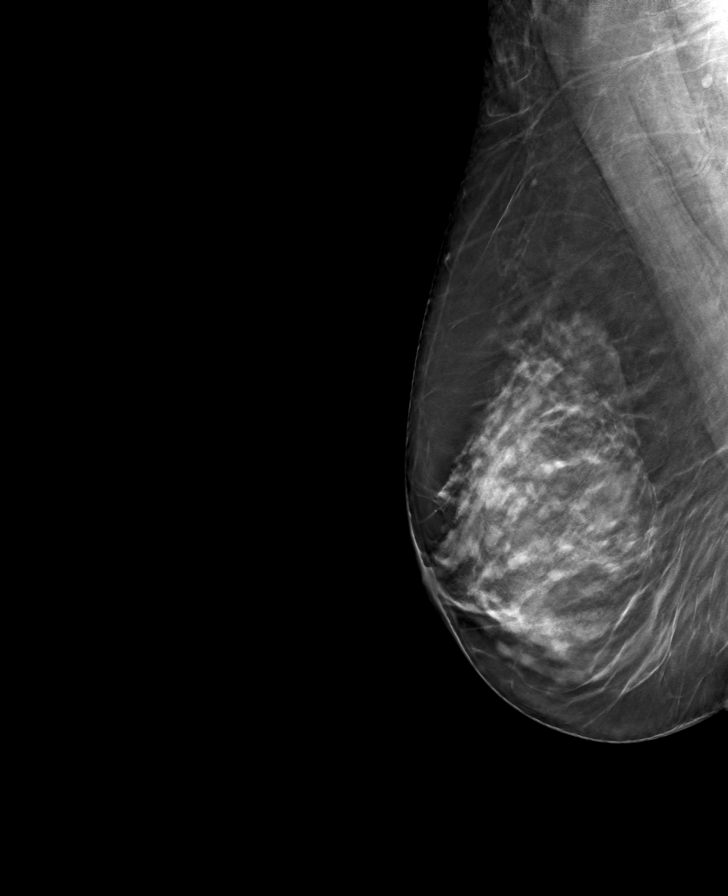

[L CC tomo · tomo slice 37/74.0]
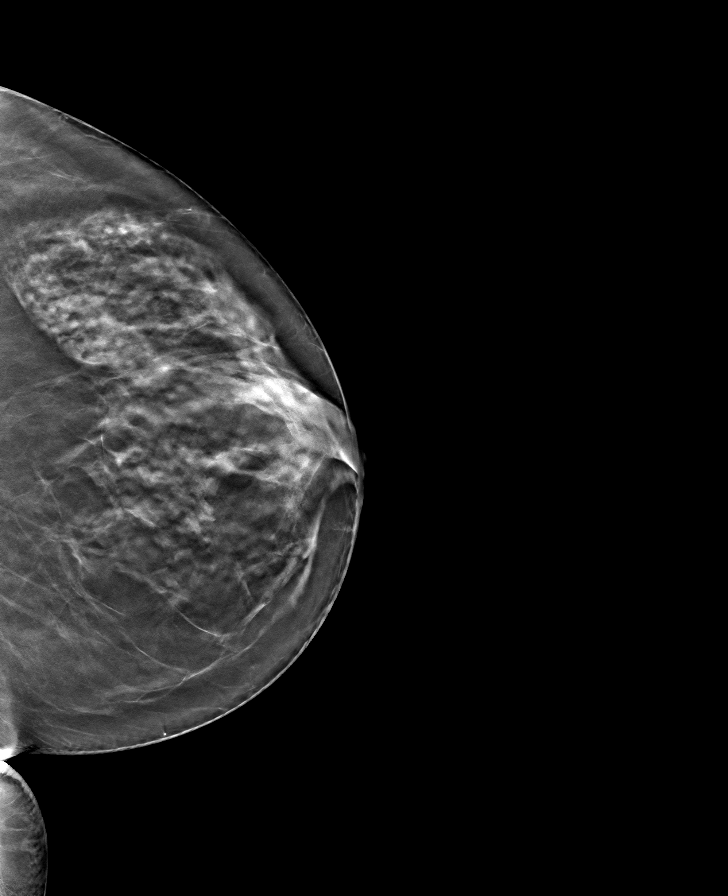

[R CC tomo · tomo slice 37/72.0]
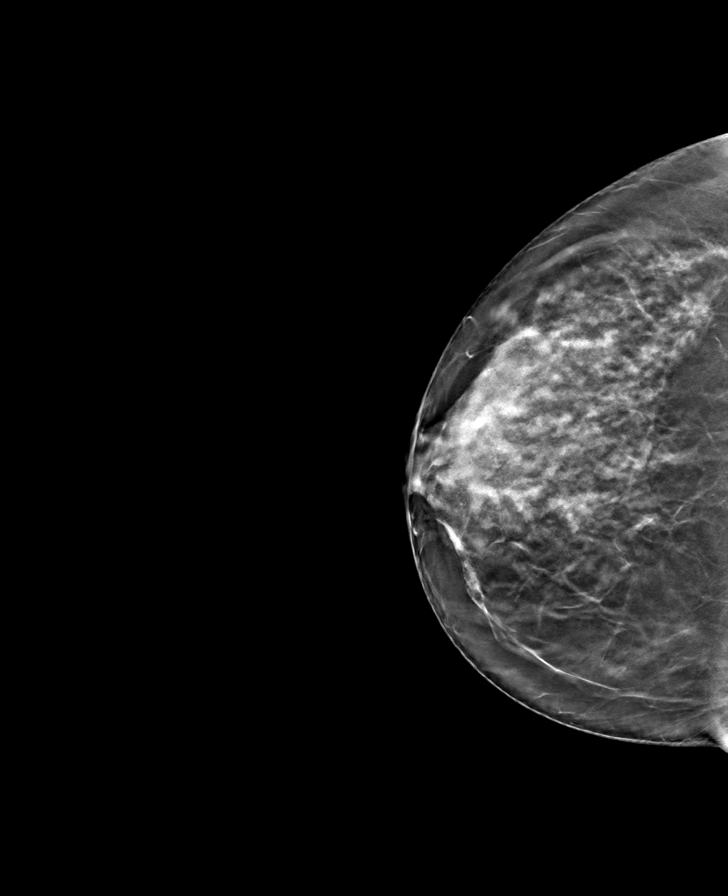

[L MLO tomo · tomo slice 39/77.0]
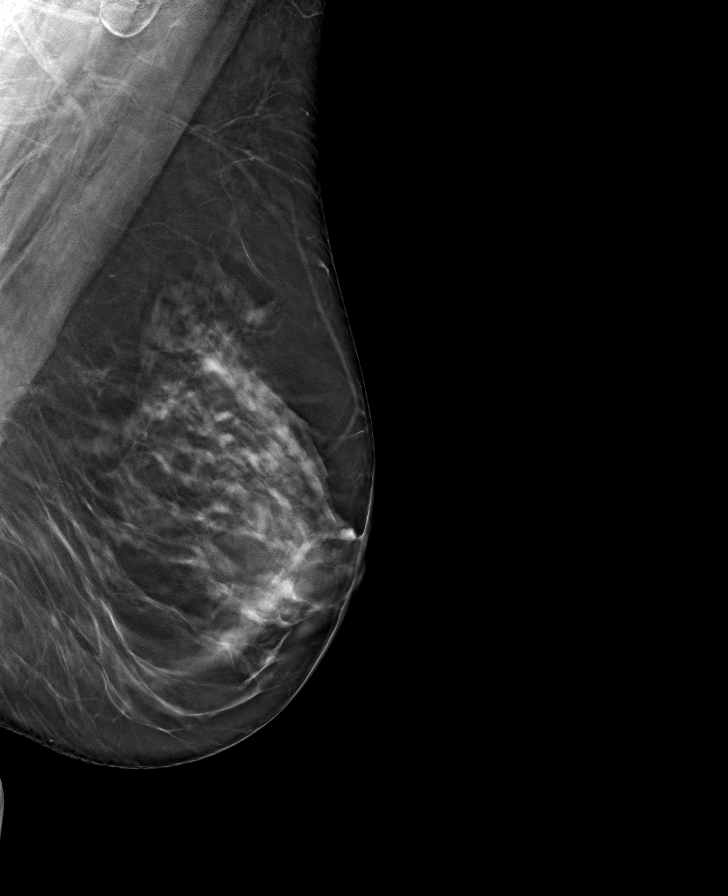

[8 of 24 positions shown; findings below may reference images not displayed]

ACR Breast Density Category c: The breast tissue is heterogeneously
dense, which may obscure small masses.
FINDINGS: There are no findings suspicious for malignancy. Images were
processed with CAD.
IMPRESSION: No mammographic evidence of malignancy. A result letter of this
screening mammogram will be mailed directly to the patient.

RECOMMENDATION:
Screening mammogram in one year. (Code:FT-U-LHB)

BI-RADS CATEGORY  1: Negative.

## 2021-07-30 DIAGNOSIS — E039 Hypothyroidism, unspecified: Secondary | ICD-10-CM | POA: Diagnosis not present

## 2021-07-30 DIAGNOSIS — E559 Vitamin D deficiency, unspecified: Secondary | ICD-10-CM | POA: Diagnosis not present

## 2021-07-30 DIAGNOSIS — Z0000 Encounter for general adult medical examination without abnormal findings: Secondary | ICD-10-CM | POA: Diagnosis not present

## 2021-07-30 DIAGNOSIS — E7800 Pure hypercholesterolemia, unspecified: Secondary | ICD-10-CM | POA: Diagnosis not present

## 2022-01-01 DIAGNOSIS — Z1211 Encounter for screening for malignant neoplasm of colon: Secondary | ICD-10-CM | POA: Diagnosis not present

## 2022-01-01 DIAGNOSIS — K219 Gastro-esophageal reflux disease without esophagitis: Secondary | ICD-10-CM | POA: Diagnosis not present

## 2022-01-01 DIAGNOSIS — Z800 Family history of malignant neoplasm of digestive organs: Secondary | ICD-10-CM | POA: Diagnosis not present

## 2022-01-01 DIAGNOSIS — F32A Depression, unspecified: Secondary | ICD-10-CM | POA: Diagnosis not present

## 2022-01-02 ENCOUNTER — Other Ambulatory Visit: Payer: Self-pay | Admitting: Obstetrics and Gynecology

## 2022-01-02 DIAGNOSIS — Z1231 Encounter for screening mammogram for malignant neoplasm of breast: Secondary | ICD-10-CM

## 2022-02-05 ENCOUNTER — Ambulatory Visit
Admission: RE | Admit: 2022-02-05 | Discharge: 2022-02-05 | Disposition: A | Discharge status: Home / Self Care | Payer: Medicare Other | Source: Ambulatory Visit | Attending: Obstetrics and Gynecology | Admitting: Obstetrics and Gynecology

## 2022-02-05 DIAGNOSIS — Z1231 Encounter for screening mammogram for malignant neoplasm of breast: Secondary | ICD-10-CM

## 2022-03-09 ENCOUNTER — Other Ambulatory Visit (HOSPITAL_BASED_OUTPATIENT_CLINIC_OR_DEPARTMENT_OTHER): Payer: Self-pay | Admitting: Family Medicine

## 2022-03-09 ENCOUNTER — Other Ambulatory Visit: Payer: Self-pay | Admitting: Family Medicine

## 2022-03-09 DIAGNOSIS — E7800 Pure hypercholesterolemia, unspecified: Secondary | ICD-10-CM

## 2022-03-12 DIAGNOSIS — E559 Vitamin D deficiency, unspecified: Secondary | ICD-10-CM | POA: Diagnosis not present

## 2022-03-18 DIAGNOSIS — Z1211 Encounter for screening for malignant neoplasm of colon: Secondary | ICD-10-CM | POA: Diagnosis not present

## 2022-03-18 DIAGNOSIS — K648 Other hemorrhoids: Secondary | ICD-10-CM | POA: Diagnosis not present

## 2022-03-18 DIAGNOSIS — D125 Benign neoplasm of sigmoid colon: Secondary | ICD-10-CM | POA: Diagnosis not present

## 2022-03-18 DIAGNOSIS — D123 Benign neoplasm of transverse colon: Secondary | ICD-10-CM | POA: Diagnosis not present

## 2022-03-18 DIAGNOSIS — K635 Polyp of colon: Secondary | ICD-10-CM | POA: Diagnosis not present

## 2022-03-26 ENCOUNTER — Ambulatory Visit (INDEPENDENT_AMBULATORY_CARE_PROVIDER_SITE_OTHER): Payer: Medicare Other | Admitting: Obstetrics and Gynecology

## 2022-03-26 ENCOUNTER — Encounter: Payer: Self-pay | Admitting: Obstetrics and Gynecology

## 2022-03-26 VITALS — BP 120/78 | HR 82 | Ht 63.0 in | Wt 158.0 lb

## 2022-03-26 DIAGNOSIS — M8589 Other specified disorders of bone density and structure, multiple sites: Secondary | ICD-10-CM

## 2022-03-26 DIAGNOSIS — Z5181 Encounter for therapeutic drug level monitoring: Secondary | ICD-10-CM | POA: Diagnosis not present

## 2022-03-26 DIAGNOSIS — Z01419 Encounter for gynecological examination (general) (routine) without abnormal findings: Secondary | ICD-10-CM | POA: Diagnosis not present

## 2022-03-26 DIAGNOSIS — E039 Hypothyroidism, unspecified: Secondary | ICD-10-CM | POA: Diagnosis not present

## 2022-03-26 MED ORDER — VITAMIN D (ERGOCALCIFEROL) 1.25 MG (50000 UNIT) PO CAPS
ORAL_CAPSULE | ORAL | 3 refills | Status: DC
Start: 2022-03-26 — End: 2023-03-15

## 2022-03-26 MED ORDER — LEVOTHYROXINE SODIUM 50 MCG PO TABS: ORAL_TABLET | ORAL | 3 refills | Status: DC

## 2022-03-26 MED ORDER — SERTRALINE HCL 50 MG PO TABS: 75.0000 mg | ORAL_TABLET | Freq: Every day | ORAL | 3 refills | Status: DC

## 2022-03-26 NOTE — Patient Instructions (Signed)

## 2022-03-26 NOTE — Progress Notes (Signed)
66 y.o. G0P0 Single Caucasian female here for annual exam.    Patient is followed for osteopenia, low vit D, hypothyroidism, and hx anxiety. Taking high dose vit D but level is dropping.  She would like to take it every 2 weeks.  Needs refill of Synthroid.  Zoloft 75 mg daily is working well for her.   Dealing with bursitis.   Stopped Crestor due to muscle pain.  Will do a cardiac calcium score.   Labs through PCP on 03/12/22.  Will be scanned in to Epic. Vit D 33.3 A/G ratio 2.5 Glucose 93 Cr 0.87 Tchol 188/HDL 75/LDL 98/TG 82 - on Crestor TSH 3.26 WBC 5.4/ Hgb 13.2/ platelets 241K A1C 5.8  PCP:  Dibas Koirala, MD  Patient's last menstrual period was 10/11/2010.           Sexually active: No.  The current method of family planning is post menopausal status.    Exercising: No.   Tennis, pickle ball, walking Smoker:  no  Health Maintenance: Pap:  03-25-21 Neg:neg HR HPV, 03-11-18 Neg:Neg HR HPV, 02-21-16 Neg:Neg HR HPV, 01-24-13 Neg:Neg HR HPV History of abnormal Pap:  no MMG:  02-05-22 Neg/Birads1 Colonoscopy:  03-18-22 adenomatous polyps;5 years BMD:  04-02-21  Result :Osteopenia left femur and left forearm.  FRAX:  11.1% major fracture risk and 1.8% hip fracture risk.  Actonel and Fosamax caused reflux in past.  TDaP:  06-02-18 Gardasil:   no HIV:Neg in past Hep C:Neg in past Screening Labs:  PCP Pneumovax Jan. 2023.     reports that she has never smoked. She has never used smokeless tobacco. She reports current alcohol use of about 3.0 standard drinks of alcohol per week. She reports that she does not use drugs.  Past Medical History:  Diagnosis Date   Adenomyosis 11/2008    on PUS   Anxiety    claustrophobic    Bursitis    hip   COVID 07/2020   Elevated LDL cholesterol level 2017   194 on 10/08/16   Fibroid 11/2008   Gall stone 12/22/2016   8 mm stone in gallbladder by ultrasound   Gastroesophageal reflux disease without esophagitis 12/18/2016   History of colon  polyps    Hypothyroidism    Osteoarthritis    hands /rt shoulder   Osteoporosis 01/2016   BMD at [facility].  T score sping -2.9, total left hip -2.5.  Foxamax started and stopped due to reflux.    Past Surgical History:  Procedure Laterality Date   CHOLECYSTECTOMY N/A 01/18/2017   Procedure: LAPAROSCOPIC CHOLECYSTECTOMY WITH INTRAOPERATIVE CHOLANGIOGRAM;  Surgeon: Coralie Keens, MD;  Location: [place];  Service: General;  Laterality: N/A;   COLONOSCOPY     TONSILLECTOMY AND ADENOIDECTOMY     age 61    Current Outpatient Medications  Medication Sig Dispense Refill   acetaminophen (TYLENOL) 500 MG tablet Take 1,000 mg by mouth every 8 (eight) hours as needed for mild pain or moderate pain.     celecoxib (CELEBREX) 200 MG capsule Take 1 tablet by mouth daily.     clobetasol cream (TEMOVATE) 0.05 % Apply topically 2 (two) times daily.     lansoprazole (PREVACID) 30 MG capsule Take 30 mg by mouth daily as needed.     levothyroxine (SYNTHROID) 50 MCG tablet TAKE 1 TABLET(50 MCG) BY MOUTH DAILY BEFORE BREAKFAST 90 tablet 3   loratadine (CLARITIN) 10 MG tablet Take 10 mg by mouth daily as needed for allergies.     [place]  5000 BOOSTER PLUS 1.1 % PSTE See admin instructions.     sertraline (ZOLOFT) 50 MG tablet Take 1.5 tablets (75 mg total) by mouth daily. 135 tablet 3   Vitamin D, Ergocalciferol, (DRISDOL) 1.25 MG (50000 UNIT) CAPS capsule Take 1 capsule (50,000 Units total) by mouth every 30 (thirty) days. 6 capsule 3   zolpidem (AMBIEN) 5 MG tablet Take by mouth.     No current facility-administered medications for this visit.    Family History  Problem Relation Age of Onset   Cirrhosis Mother    Breast cancer Neg Hx     Review of Systems  All other systems reviewed and are negative.   Exam:   BP 120/78   Pulse 82   Ht '5\' 3"'$  (1.6 m)   Wt 158 lb (71.7 kg)   LMP 10/11/2010   SpO2 96%   BMI 27.99 kg/m     General appearance: alert, cooperative and appears stated  age Head: normocephalic, without obvious abnormality, atraumatic Neck: no adenopathy, supple, symmetrical, trachea midline and thyroid normal to inspection and palpation Lungs: clear to auscultation bilaterally Breasts: normal appearance, no masses or tenderness, No nipple retraction or dimpling, No nipple discharge or bleeding, No axillary adenopathy Heart: regular rate and rhythm Abdomen: soft, non-tender; no masses, no organomegaly Extremities: extremities normal, atraumatic, no cyanosis or edema Skin: skin color, texture, turgor normal. No rashes or lesions Lymph nodes: cervical, supraclavicular, and axillary nodes normal. Neurologic: grossly normal  Pelvic: External genitalia:  no lesions              No abnormal inguinal nodes palpated.              Urethra:  normal appearing urethra with no masses, tenderness or lesions              Bartholins and Skenes: normal                 Vagina: normal appearing vagina with normal color and discharge, no lesions              Cervix: no lesions              Pap taken: no Bimanual Exam:  Uterus:  normal size, contour, position, consistency, mobility, non-tender              Adnexa: no mass, fullness, tenderness              Rectal exam: Yes.  .  Confirms.              Anus:  normal sphincter tone, no lesions  Chaperone was present for exam:  Estill Bamberg, CMA  Assessment:   Well woman visit with gynecologic exam. Hypothyroidism.  Osteopenia.  Off Actonel.  GERD. Hx vit D deficiency.  Hx anxiety.  On Zoloft. Hyperlipidemia.  Encounter for medication monitoring.  Plan: Mammogram screening discussed. Self breast awareness reviewed. Pap and HR HPV 2027. Guidelines for Calcium, Vitamin D, regular exercise program including cardiovascular and weight bearing exercise. Refill of Synthroid 50 mcg daily for one year.  Refill of Zoloft 75 mg daily for one year.  Rx for Vit D 50,000 IU every 2 weeks for one year.  She has her labs drawn through  her PCP.  BMD in 2024  We briefly discussed flu vaccine, RSV vaccine and Covid booster.  Follow up annually and prn.   After visit summary provided.   25 min  total time was spent for this patient encounter,  including preparation, face-to-face counseling with the patient, coordination of care, and documentation of the encounter.

## 2022-04-08 ENCOUNTER — Ambulatory Visit (HOSPITAL_BASED_OUTPATIENT_CLINIC_OR_DEPARTMENT_OTHER)
Admission: RE | Admit: 2022-04-08 | Discharge: 2022-04-08 | Disposition: A | Discharge status: Home / Self Care | Payer: Medicare Other | Source: Ambulatory Visit | Attending: Family Medicine | Admitting: Family Medicine

## 2022-04-08 DIAGNOSIS — E7800 Pure hypercholesterolemia, unspecified: Secondary | ICD-10-CM | POA: Insufficient documentation

## 2022-04-22 DIAGNOSIS — Z23 Encounter for immunization: Secondary | ICD-10-CM | POA: Diagnosis not present

## 2022-05-27 DIAGNOSIS — H5213 Myopia, bilateral: Secondary | ICD-10-CM | POA: Diagnosis not present

## 2022-05-27 DIAGNOSIS — H35373 Puckering of macula, bilateral: Secondary | ICD-10-CM | POA: Diagnosis not present

## 2022-08-01 DIAGNOSIS — I471 Supraventricular tachycardia, unspecified: Secondary | ICD-10-CM | POA: Diagnosis not present

## 2022-08-01 DIAGNOSIS — I4891 Unspecified atrial fibrillation: Secondary | ICD-10-CM | POA: Diagnosis not present

## 2022-08-01 DIAGNOSIS — R002 Palpitations: Secondary | ICD-10-CM | POA: Diagnosis not present

## 2022-08-04 ENCOUNTER — Ambulatory Visit: Payer: Medicare Other | Attending: Cardiology | Admitting: Cardiology

## 2022-08-04 ENCOUNTER — Encounter: Payer: Self-pay | Admitting: Cardiology

## 2022-08-04 VITALS — BP 120/80 | HR 78 | Ht 63.0 in | Wt 160.6 lb

## 2022-08-04 DIAGNOSIS — I4891 Unspecified atrial fibrillation: Secondary | ICD-10-CM

## 2022-08-04 MED ORDER — DILTIAZEM HCL 30 MG PO TABS: 30.0000 mg | ORAL_TABLET | Freq: Four times a day (QID) | ORAL | 3 refills | Status: DC | PRN

## 2022-08-04 NOTE — Patient Instructions (Addendum)
Medication Instructions:  You may take Diltiazem 30 mg once every 4 hours for rapid heart beat. Continue all other medications as listed.  *If you need a refill on your cardiac medications before your next appointment, please call your pharmacy*  Testing/Procedures: Your physician has requested that you have an echocardiogram. Echocardiography is a painless test that uses sound waves to create images of your heart. It provides your doctor with information about the size and shape of your heart and how well your heart's chambers and valves are working. This procedure takes approximately one hour. There are no restrictions for this procedure. Please do NOT wear cologne, perfume, aftershave, or lotions (deodorant is allowed). Please arrive 15 minutes prior to your appointment time.  Follow-Up: At [facility], you and your health needs are our priority.  As part of our continuing mission to provide you with exceptional heart care, we have created designated Provider Care Teams.  These Care Teams include your primary Cardiologist (physician) and Advanced Practice Providers (APPs -  Physician Assistants and Nurse Practitioners) who all work together to provide you with the care you need, when you need it.  We recommend signing up for the patient portal called "MyChart".  Sign up information is provided on this After Visit Summary.  MyChart is used to connect with patients for Virtual Visits (Telemedicine).  Patients are able to view lab/test results, encounter notes, upcoming appointments, etc.  Non-urgent messages can be sent to your provider as well.   To learn more about what you can do with MyChart, go to [email or web address].    Your next appointment:   1 year(s)  Provider:   Dr Candee Furbish

## 2022-08-04 NOTE — Progress Notes (Signed)
Cardiology Office Note:    Date:  08/04/2022   ID:  Carla Schmidt, DOB Aug 10, 1955, MRN [id number]  PCP:  Lujean Amel, [place] Prov[facility] Cardiologist:  Candee Furbish, MD     Referring MD: Lujean Amel, MD    History of Present Illness:    Carla Schmidt is a 67 y.o. female here for the evaluation of abnormal heart rhythm, paroxysmal atrial fibrillation that was experienced in the [facility].  Apple watch AFIB 123 bpm, at basketball game, up to 152 bpm felt palpitations, Apple Watch said AFIB. ECG showed SVT, adenosine given and it saw afib underneath. IV metoprolol helped. Lasted for 1 hour. Troponin was normal.   Christmas right after cold, cough. Never felt very bad. Had [place] in 03/2022.   Prior to the atrial fibrillation, was drinking a few cups of coffee a day and an occasional glass of wine.  No tob  Calcium score 6 LAD.   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  CHADS VASC 2 (Female, Age)  Past Medical History:  Diagnosis Date   Adenomyosis 11/2008    on PUS   Anxiety    claustrophobic    Bursitis    hip   COVID 07/2020   Elevated LDL cholesterol level 2017   194 on 10/08/16   Fibroid 11/2008   Gall stone 12/22/2016   8 mm stone in gallbladder by ultrasound   Gastroesophageal reflux disease without esophagitis 12/18/2016   History of colon polyps    Hypothyroidism    Osteoarthritis    hands /rt shoulder   Osteoporosis 01/2016   BMD at [facility].  T score sping -2.9, total left hip -2.5.  Foxamax started and stopped due to reflux.    Past Surgical History:  Procedure Laterality Date   CHOLECYSTECTOMY N/A 01/18/2017   Procedure: LAPAROSCOPIC CHOLECYSTECTOMY WITH INTRAOPERATIVE CHOLANGIOGRAM;  Surgeon: Coralie Keens, MD;  Location: [place];  Service: General;  Laterality: N/A;   COLONOSCOPY     TONSILLECTOMY AND ADENOIDECTOMY     age 65    Current Medications: Current Meds  Medication Sig   acetaminophen  (TYLENOL) 500 MG tablet Take 1,000 mg by mouth every 8 (eight) hours as needed for mild pain or moderate pain.   celecoxib (CELEBREX) 200 MG capsule Take 1 tablet by mouth as needed.   clobetasol cream (TEMOVATE) 0.05 % Apply topically as needed.   diltiazem (CARDIZEM) 30 MG tablet Take 1 tablet (30 mg total) by mouth 4 (four) times daily as needed.   lansoprazole (PREVACID) 30 MG capsule Take 30 mg by mouth daily as needed.   levothyroxine (SYNTHROID) 50 MCG tablet TAKE 1 TABLET(50 MCG) BY MOUTH DAILY BEFORE BREAKFAST   loratadine (CLARITIN) 10 MG tablet Take 10 mg by mouth daily as needed for allergies.   PREVIDENT 5000 BOOSTER PLUS 1.1 % PSTE See admin instructions.   sertraline (ZOLOFT) 50 MG tablet Take 1.5 tablets (75 mg total) by mouth daily.   Vitamin D, Ergocalciferol, (DRISDOL) 1.25 MG (50000 UNIT) CAPS capsule Take one capsule (50,000 units) by mouth every 2 weeks.   zolpidem (AMBIEN) 5 MG tablet Take 5 mg by mouth as needed.     Allergies:   Tylenol with codeine #3 [acetaminophen-codeine]   Social History   Socioeconomic History   Marital status: Single    Spouse name: Not on file   Number of children: Not on file   Years of education: Not on file   Highest  education level: Not on file  Occupational History   Not on file  Tobacco Use   Smoking status: Never   Smokeless tobacco: Never  Vaping Use   Vaping Use: Never used  Substance and Sexual Activity   Alcohol use: Yes    Alcohol/week: 3.0 standard drinks of alcohol    Types: 3 Glasses of wine per week   Drug use: No   Sexual activity: Not Currently    Birth control/protection: Post-menopausal  Other Topics Concern   Not on file  Social History Narrative   Not on file   Social Determinants of Health   Financial Resource Strain: Not on file  Food Insecurity: Not on file  Transportation Needs: Not on file  Physical Activity: Not on file  Stress: Not on file  Social Connections: Not on file     Family  History: The patient's family history includes Cirrhosis in her mother. There is no history of Breast cancer.  ROS:   Please see the history of present illness.    No fevers chills nausea vomiting syncope bleeding no chest pain all other systems reviewed and are negative.  EKGs/Labs/Other Studies Reviewed:    The following studies were reviewed today: Prior ER/outside labs/outside notes EKGs reviewed.  Lab work reviewed.  Renal function normal.  EKG:  The ekg ordered today demonstrates  08/04/2022 shows sinus rhythm 78 nonspecific T wave flattening.    Recent Labs: No results found for requested labs within last 365 days.  Recent Lipid Panel No results found for: "CHOL", "TRIG", "HDL", "CHOLHDL", "VLDL", "LDLCALC", "LDLDIRECT"   Risk Assessment/Calculations:    CHA2DS2-VASc Score = 2   This indicates a 2.2% annual risk of stroke. The patient's score is based upon: CHF History: 0 HTN History: 0 Diabetes History: 0 Stroke History: 0 Vascular Disease History: 0 Age Score: 1 Gender Score: 1                Physical Exam:    VS:  BP 120/80   Pulse 78   Ht '5\' 3"'$  (1.6 m)   Wt 160 lb 9.6 oz (72.8 kg)   LMP 10/11/2010   SpO2 96%   BMI 28.45 kg/m     Wt Readings from Last 3 Encounters:  08/04/22 160 lb 9.6 oz (72.8 kg)  03/26/22 158 lb (71.7 kg)  03/25/21 160 lb (72.6 kg)     GEN:  Well nourished, well developed in no acute distress HEENT: Normal NECK: No JVD; No carotid bruits LYMPHATICS: No lymphadenopathy CARDIAC: RRR, no murmurs, rubs, gallops RESPIRATORY:  Clear to auscultation without rales, wheezing or rhonchi  ABDOMEN: Soft, non-tender, non-distended MUSCULOSKELETAL:  No edema; No deformity  SKIN: Warm and dry NEUROLOGIC:  Alert and oriented x 3 PSYCHIATRIC:  Normal affect   ASSESSMENT:    1. Atrial fibrillation, unspecified type ([place])    PLAN:    In order of problems listed above:  Paroxysmal atrial fibrillation -Episode may have been  precipitated by recent viral illness that was on the tail end, bronchitis-like.  She has not had any further episodes of atrial fibrillation. - Echocardiogram we will check. -TSH/thyroid was overall normal.  She does take Synthroid. -We have given her diltiazem 30 mg as needed to take if she does have another episode.  I have encouraged her to reach out to [place] if this does happen.  We do have the atrial fibrillation clinic if necessary.  She does not need to go to the emergency room unless symptoms  are severe. -We had a discussion about anticoagulation and since she has a CHA2DS2-VASc score of 2, 1 for female, 1 for age greater than 41, I think it is reasonable to hold off on anticoagulation at this time.  Obviously if atrial fibrillation returns or becomes more lengthy or unprovoked, it would not be unreasonable to once again revisit this subject.            Medication Adjustments/Labs and Tests Ordered: Current medicines are reviewed at length with the patient today.  Concerns regarding medicines are outlined above.  Orders Placed This Encounter  Procedures   EKG 12-Lead   ECHOCARDIOGRAM COMPLETE   Meds ordered this encounter  Medications   diltiazem (CARDIZEM) 30 MG tablet    Sig: Take 1 tablet (30 mg total) by mouth 4 (four) times daily as needed.    Dispense:  30 tablet    Refill:  3    Patient Instructions  Medication Instructions:  You may take Diltiazem 30 mg once every 4 hours for rapid heart beat. Continue all other medications as listed.  *If you need a refill on your cardiac medications before your next appointment, please call your pharmacy*  Testing/Procedures: Your physician has requested that you have an echocardiogram. Echocardiography is a painless test that uses sound waves to create images of your heart. It provides your doctor with information about the size and shape of your heart and how well your heart's chambers and valves are working. This procedure takes  approximately one hour. There are no restrictions for this procedure. Please do NOT wear cologne, perfume, aftershave, or lotions (deodorant is allowed). Please arrive 15 minutes prior to your appointment time.  Follow-Up: At [facility], you and your health needs are our priority.  As part of our continuing mission to provide you with exceptional heart care, we have created designated Provider Care Teams.  These Care Teams include your primary Cardiologist (physician) and Advanced Practice Providers (APPs -  Physician Assistants and Nurse Practitioners) who all work together to provide you with the care you need, when you need it.  We recommend signing up for the patient portal called "MyChart".  Sign up information is provided on this After Visit Summary.  MyChart is used to connect with patients for Virtual Visits (Telemedicine).  Patients are able to view lab/test results, encounter notes, upcoming appointments, etc.  Non-urgent messages can be sent to your provider as well.   To learn more about what you can do with MyChart, go to [email or web address].    Your next appointment:   1 year(s)  Provider:   Dr Candee Furbish        Signed, Candee Furbish, MD  08/04/2022 5:08 PM    [place]

## 2022-08-10 DIAGNOSIS — E7800 Pure hypercholesterolemia, unspecified: Secondary | ICD-10-CM | POA: Diagnosis not present

## 2022-08-10 DIAGNOSIS — E559 Vitamin D deficiency, unspecified: Secondary | ICD-10-CM | POA: Diagnosis not present

## 2022-08-10 DIAGNOSIS — R7309 Other abnormal glucose: Secondary | ICD-10-CM | POA: Diagnosis not present

## 2022-08-10 DIAGNOSIS — Z79899 Other long term (current) drug therapy: Secondary | ICD-10-CM | POA: Diagnosis not present

## 2022-08-12 DIAGNOSIS — Z0001 Encounter for general adult medical examination with abnormal findings: Secondary | ICD-10-CM | POA: Diagnosis not present

## 2022-09-01 ENCOUNTER — Telehealth: Payer: Self-pay | Admitting: Cardiology

## 2022-09-01 MED ORDER — APIXABAN 5 MG PO TABS: 5.0000 mg | ORAL_TABLET | Freq: Two times a day (BID) | ORAL | 6 refills | Status: DC

## 2022-09-01 NOTE — Telephone Encounter (Signed)
Jerline Pain, MD  YouJust now (5:28 PM)    Spoke with her on phone.  At the end of conversation, her Apple Watch did say sinus rhythm in the 90s.  We will go ahead and prescribe Eliquis 5 mg twice a day.  She has an echocardiogram Thursday.  She will update me tomorrow morning about her rhythm status.  Told her that she has liberty to take an additional diltiazem 30 mg.  She hydrated with a V8 with salt.  She just came back from [place] and knows that she was dehydrated yesterday.  Atrial fibrillation episode occurred while playing tennis.  Spoke to her about possibilities of cardioversion, TEE cardioversion, possible EP consultation for ablation if she continues to have more episodes.  Candee Furbish, MD    RX for Eliquis 5 mg BID sent into pharmacy as ordered.  #60 X 6

## 2022-09-01 NOTE — Telephone Encounter (Signed)
Patient c/o Palpitations:  High priority if patient c/o lightheadedness, shortness of breath, or chest pain  How long have you had palpitations/irregular HR/ Afib? Started about an hour ago.  Are you having the symptoms now? Yes   Are you currently experiencing lightheadedness, SOB or CP? No   Do you have a history of afib (atrial fibrillation) or irregular heart rhythm? Yes   Have you checked your BP or HR? (document readings if available): HR was 155, took diltiazem slowed HR to 90-99, after she took the medication her BP is 100/70  Are you experiencing any other symptoms? She said she feels a little dizzy after she took the diltiazem.

## 2022-09-01 NOTE — Telephone Encounter (Signed)
Called patient back about message. Patient stated she started having palpitations after playing tennis and her HR was 150's to 160's, and Apple Watch showed she was in A. FIB. Patient took diltiazem 30 mg, and then an hour later patient's HR is down to 90 to 100, and SBP 90's. Patient is concern because she is still having palpitations. Will consult Dr. Marlou Porch for advisement.

## 2022-09-03 ENCOUNTER — Ambulatory Visit (HOSPITAL_COMMUNITY): Payer: Medicare Other | Attending: Cardiology

## 2022-09-03 DIAGNOSIS — I4891 Unspecified atrial fibrillation: Secondary | ICD-10-CM

## 2022-09-03 LAB — ECHOCARDIOGRAM COMPLETE
Area-P 1/2: 3.48 cm2
MV M vel: 4.25 m/s
MV Peak grad: 72.3 mmHg
P 1/2 time: 517 msec
Radius: 0.5 cm
S' Lateral: 3 cm

## 2022-10-14 LAB — LAB REPORT - SCANNED
A1c: 6.1
EGFR: 72

## 2022-11-24 DIAGNOSIS — L239 Allergic contact dermatitis, unspecified cause: Secondary | ICD-10-CM | POA: Diagnosis not present

## 2022-11-24 DIAGNOSIS — I480 Paroxysmal atrial fibrillation: Secondary | ICD-10-CM | POA: Diagnosis not present

## 2022-11-30 DIAGNOSIS — L239 Allergic contact dermatitis, unspecified cause: Secondary | ICD-10-CM | POA: Diagnosis not present

## 2022-12-15 ENCOUNTER — Other Ambulatory Visit: Payer: Self-pay | Admitting: Obstetrics and Gynecology

## 2022-12-15 DIAGNOSIS — Z1231 Encounter for screening mammogram for malignant neoplasm of breast: Secondary | ICD-10-CM

## 2022-12-28 DIAGNOSIS — K080 Exfoliation of teeth due to systemic causes: Secondary | ICD-10-CM | POA: Diagnosis not present

## 2023-02-15 ENCOUNTER — Ambulatory Visit
Admission: RE | Admit: 2023-02-15 | Discharge: 2023-02-15 | Disposition: A | Discharge status: Home / Self Care | Payer: Medicare Other | Source: Ambulatory Visit

## 2023-02-15 ENCOUNTER — Encounter (INDEPENDENT_AMBULATORY_CARE_PROVIDER_SITE_OTHER): Payer: Medicare Other | Admitting: Cardiology

## 2023-02-15 DIAGNOSIS — I480 Paroxysmal atrial fibrillation: Secondary | ICD-10-CM

## 2023-02-15 DIAGNOSIS — Z1231 Encounter for screening mammogram for malignant neoplasm of breast: Secondary | ICD-10-CM | POA: Diagnosis not present

## 2023-02-16 NOTE — Telephone Encounter (Signed)
Please see the MyChart message reply(ies) for my assessment and plan.   Keniesha, Thanks for the update.  You did the right thing by taking the diltiazem.  Continue with the Eliquis for anticoagulation.  I think it would be helpful to sit down with one of our electrophysiologists to discuss potential A-fib ablation.  At this stage where your left atrium is structurally normal and you are having brief/more infrequent episodes, the success rate can be high with ablation.  It would at least be worth a discussion.  I can go ahead and have my team send in a referral.  This patient gave consent for this Medical Advice Message and is aware that it may result in a bill to [employer], as well as the possibility of receiving a bill for a co-payment or deductible. They are an established patient, but are not seeking medical advice exclusively about a problem treated during an in person or video visit in the last seven days. I did not recommend an in person or video visit within seven days of my reply.    I spent a total of 6 minutes cumulative time within 7 days through [employer].  Donato Schultz, MD

## 2023-03-14 ENCOUNTER — Other Ambulatory Visit: Payer: Self-pay | Admitting: Obstetrics and Gynecology

## 2023-03-15 NOTE — Progress Notes (Signed)
67 y.o. G0P0 Single Caucasian female here for follow up of osteopenia, low vit D, hypothyroidism, and hx anxiety.   She is treated with high dose vit D, Synthroid, and Zoloft.  Doing well on her medications.  Would like to continue Zoloft.     A copy of her routine labs accompany her to her visit today and will be scanned in to Epic.  TSH 1.890. A1C 6.1. Vit D 44.0.  New dx of atrial fibrillation.  She will see cardiac electrophysiologist.  Taking Eliquis.   PCP:   Dr. Carilyn Goodpasture  Patient's last menstrual period was 10/11/2010.           Sexually active: No.  The current method of family planning is post menopausal status.    Exercising: Yes.     Walking, tennis , pickleball Smoker:  no  Health Maintenance: Pap:  03-25-21 Neg:neg HR HPV, 03-11-18 Neg:Neg HR HPV, 02-21-16 Neg:Neg HR HPV, 01-24-13 Neg:Neg HR HPV  History of abnormal Pap:  no MMG:  02/15/23 Breast Density Cat C, BI-RADS CAT 1 neg Colonoscopy:  03/18/22 BMD:   04/02/21  Result  osteopenia TDaP:  06/02/18 Gardasil:   no HIV: neg in past Hep C: neg in past Screening Labs:  PCP   reports that she has never smoked. She has never used smokeless tobacco. She reports current alcohol use of about 3.0 standard drinks of alcohol per week. She reports that she does not use drugs.  Past Medical History:  Diagnosis Date   Adenomyosis 11/2008    on PUS   Anxiety    claustrophobic    Atrial fibrillation (HCC)    Bursitis    hip   COVID 07/2020   Elevated LDL cholesterol level 2017   194 on 10/08/16   Fibroid 11/2008   Gall stone 12/22/2016   8 mm stone in gallbladder by ultrasound   Gastroesophageal reflux disease without esophagitis 12/18/2016   History of colon polyps    Hypothyroidism    Osteoarthritis    hands /rt shoulder   Osteoporosis 01/2016   BMD at [facility].  T score sping -2.9, total left hip -2.5.  Foxamax started and stopped due to reflux.    Past Surgical History:  Procedure Laterality Date    CHOLECYSTECTOMY N/A 01/18/2017   Procedure: LAPAROSCOPIC CHOLECYSTECTOMY WITH INTRAOPERATIVE CHOLANGIOGRAM;  Surgeon: Abigail Miyamoto, MD;  Location: [facility];  Service: General;  Laterality: N/A;   COLONOSCOPY     TONSILLECTOMY AND ADENOIDECTOMY     age 44    Current Outpatient Medications  Medication Sig Dispense Refill   acetaminophen (TYLENOL) 500 MG tablet Take 1,000 mg by mouth every 8 (eight) hours as needed for mild pain or moderate pain.     apixaban (ELIQUIS) 5 MG TABS tablet Take 1 tablet (5 mg total) by mouth 2 (two) times daily. 60 tablet 6   clobetasol cream (TEMOVATE) 0.05 % Apply topically as needed.     diltiazem (CARDIZEM) 30 MG tablet Take 1 tablet (30 mg total) by mouth 4 (four) times daily as needed. 30 tablet 3   lansoprazole (PREVACID) 30 MG capsule Take 30 mg by mouth daily as needed.     levothyroxine (SYNTHROID) 50 MCG tablet TAKE 1 TABLET(50 MCG) BY MOUTH DAILY BEFORE BREAKFAST 90 tablet 3   loratadine (CLARITIN) 10 MG tablet Take 10 mg by mouth daily as needed for allergies.     PREVIDENT 5000 BOOSTER PLUS 1.1 % PSTE See admin instructions.     rosuvastatin (  CRESTOR) 5 MG tablet 1 tablet Orally every other day for 90 days     sertraline (ZOLOFT) 50 MG tablet Take 1.5 tablets (75 mg total) by mouth daily. 135 tablet 3   Vitamin D, Ergocalciferol, (DRISDOL) 1.25 MG (50000 UNIT) CAPS capsule TAKE 1 CAPSULE BY MOUTH EVERY 2 WEEKS 6 capsule 0   zolpidem (AMBIEN) 5 MG tablet Take 5 mg by mouth as needed.     No current facility-administered medications for this visit.    Family History  Problem Relation Age of Onset   Cirrhosis Mother    Breast cancer Neg Hx     Review of Systems  All other systems reviewed and are negative.   Exam:   BP 112/74 (BP Location: Right Arm, Patient Position: Sitting, Cuff Size: Normal)   Ht 5\' 3"  (1.6 m)   Wt 160 lb (72.6 kg)   LMP 10/11/2010   BMI 28.34 kg/m     General appearance: alert, cooperative and appears stated  age Head: normocephalic, without obvious abnormality, atraumatic Neck: no adenopathy, supple, symmetrical, trachea midline and thyroid normal to inspection and palpation Lungs: clear to auscultation bilaterally Breasts: normal appearance, no masses or tenderness, No nipple retraction or dimpling, No nipple discharge or bleeding, No axillary adenopathy Heart: regular rate and rhythm Abdomen: soft, non-tender; no masses, no organomegaly Extremities: extremities normal, atraumatic, no cyanosis or edema Skin: skin color, texture, turgor normal. No rashes or lesions Lymph nodes: cervical, supraclavicular, and axillary nodes normal. Neurologic: grossly normal  Pelvic: External genitalia:  no lesions              No abnormal inguinal nodes palpated.              Urethra:  normal appearing urethra with no masses, tenderness or lesions              Bartholins and Skenes: normal                 Vagina: normal appearing vagina with normal color and discharge, no lesions              Cervix: no lesions              Bimanual Exam:  Uterus:  normal size, contour, position, consistency, mobility, non-tender              Adnexa: no mass, fullness, tenderness              Rectal exam: yes.  Confirms.              Anus:  normal sphincter tone, no lesions  Chaperone was present for exam:  Warren Lacy, CMA  Assessment:   Hypothyroidism.  Osteopenia.  Off Actonel.  GERD. Hx vit D deficiency.  Hx anxiety.  On Zoloft. Hyperlipidemia.  Atrial fibrillation.  Encounter for medication monitoring.  Plan: Mammogram screening discussed. Self breast awareness reviewed. Guidelines for Calcium, Vitamin D, regular exercise program including cardiovascular and weight bearing exercise. BMD ordered.  Refill of Synthroid 50 mcg daily, Zoloft 75 mg daily, vit D 50,000 international units every 2 weeks. Follow up annually and prn.   25 min  total time was spent for this patient encounter, including preparation,  face-to-face counseling with the patient, coordination of care, and documentation of the encounter.

## 2023-03-15 NOTE — Telephone Encounter (Signed)
Med refill request: Vitamin D Last AEX: 03/26/22 Next AEX: 03/29/23 Last MMG (if hormonal med) 02/15/23 Refill authorized: Please Advise, #6, 3 RF

## 2023-03-29 ENCOUNTER — Encounter: Payer: Self-pay | Admitting: Obstetrics and Gynecology

## 2023-03-29 ENCOUNTER — Ambulatory Visit (INDEPENDENT_AMBULATORY_CARE_PROVIDER_SITE_OTHER): Payer: Medicare Other | Admitting: Obstetrics and Gynecology

## 2023-03-29 VITALS — BP 112/74 | Ht 63.0 in | Wt 160.0 lb

## 2023-03-29 DIAGNOSIS — M8580 Other specified disorders of bone density and structure, unspecified site: Secondary | ICD-10-CM

## 2023-03-29 DIAGNOSIS — E039 Hypothyroidism, unspecified: Secondary | ICD-10-CM

## 2023-03-29 DIAGNOSIS — F419 Anxiety disorder, unspecified: Secondary | ICD-10-CM | POA: Diagnosis not present

## 2023-03-29 DIAGNOSIS — Z5181 Encounter for therapeutic drug level monitoring: Secondary | ICD-10-CM

## 2023-03-29 DIAGNOSIS — Z8639 Personal history of other endocrine, nutritional and metabolic disease: Secondary | ICD-10-CM

## 2023-03-29 MED ORDER — VITAMIN D (ERGOCALCIFEROL) 1.25 MG (50000 UNIT) PO CAPS
ORAL_CAPSULE | ORAL | 3 refills | Status: DC
Start: 2023-03-29 — End: 2024-03-30

## 2023-03-29 MED ORDER — LEVOTHYROXINE SODIUM 50 MCG PO TABS: ORAL_TABLET | ORAL | 3 refills | Status: DC

## 2023-03-29 MED ORDER — SERTRALINE HCL 50 MG PO TABS: 75.0000 mg | ORAL_TABLET | Freq: Every day | ORAL | 3 refills | Status: DC

## 2023-04-06 ENCOUNTER — Ambulatory Visit: Payer: Medicare Other | Attending: Cardiology | Admitting: Cardiology

## 2023-04-06 ENCOUNTER — Encounter: Payer: Self-pay | Admitting: Cardiology

## 2023-04-06 ENCOUNTER — Ambulatory Visit: Payer: Medicare Other | Admitting: Cardiology

## 2023-04-06 VITALS — BP 118/68 | HR 68 | Ht 63.0 in | Wt 161.8 lb

## 2023-04-06 DIAGNOSIS — I480 Paroxysmal atrial fibrillation: Secondary | ICD-10-CM | POA: Diagnosis not present

## 2023-04-06 NOTE — Patient Instructions (Signed)
Medication Instructions:  Your physician recommends that you continue on your current medications as directed. Please refer to the Current Medication list given to you today.  *If you need a refill on your cardiac medications before your next appointment, please call your pharmacy*   Lab Work: Pre procedure labs -- see procedure instruction letter:  BMP & CBC  If you have any lab test that is abnormal and we need to change your treatment, we will call you to review the results -- otherwise no news is good news.    Testing/Procedures: Your physician has requested that you have cardiac CT within 10 days PRIOR to your ablation. Cardiac computed tomography (CT) is a painless test that uses an x-ray machine to take clear, detailed pictures of your heart.  Please follow instruction below located under "other instructions". You will get a call from our office to schedule the date for this test.  Your physician has recommended that you have an ablation. Catheter ablation is a medical procedure used to treat some cardiac arrhythmias (irregular heartbeats). During catheter ablation, a long, thin, flexible tube is put into a blood vessel in your groin (upper thigh), or neck. This tube is called an ablation catheter. It is then guided to your heart through the blood vessel. Radio frequency waves destroy small areas of heart tissue where abnormal heartbeats may cause an arrhythmia to start.   You will be scheduled for 08/19/2023.  The EP scheduler, April, will be in touch with CT & procedure instructions.   Follow-Up: At [facility], you and your health needs are our priority.  As part of our continuing mission to provide you with exceptional heart care, we have created designated Provider Care Teams.  These Care Teams include your primary Cardiologist (physician) and Advanced Practice Providers (APPs -  Physician Assistants and Nurse Practitioners) who all work together to provide you with the care you  need, when you need it.   Your next appointment:   1 month(s) after your ablation  The format for your next appointment:   In Person  Provider:   AFib clinic   Thank you for choosing [facility]!!   Dory Horn, RN [phone]    Other Instructions   Cardiac Ablation Cardiac ablation is a procedure to destroy (ablate) some heart tissue that is sending bad signals. These bad signals cause problems in heart rhythm. The heart has many areas that make these signals. If there are problems in these areas, they can make the heart beat in a way that is not normal. Destroying some tissues can help make the heart rhythm normal. Tell your doctor about: Any allergies you have. All medicines you are taking. These include vitamins, herbs, eye drops, creams, and over-the-counter medicines. Any problems you or family members have had with medicines that make you fall asleep (anesthetics). Any blood disorders you have. Any surgeries you have had. Any medical conditions you have, such as kidney failure. Whether you are pregnant or may be pregnant. What are the risks? This is a safe procedure. But problems may occur, including: Infection. Bruising and bleeding. Bleeding into the chest. Stroke or blood clots. Damage to nearby areas of your body. Allergies to medicines or dyes. The need for a pacemaker if the normal system is damaged. Failure of the procedure to treat the problem. What happens before the procedure? Medicines Ask your doctor about: Changing or stopping your normal medicines. This is important. Taking aspirin and ibuprofen. Do not take these medicines  unless your doctor tells you to take them. Taking other medicines, vitamins, herbs, and supplements. General instructions Follow instructions from your doctor about what you cannot eat or drink. Plan to have someone take you home from the hospital or clinic. If you will be going home right after the procedure, plan  to have someone with you for 24 hours. Ask your doctor what steps will be taken to prevent infection. What happens during the procedure?  An IV tube will be put into one of your veins. You will be given a medicine to help you relax. The skin on your neck or groin will be numbed. A cut (incision) will be made in your neck or groin. A needle will be put through your cut and into a large vein. A tube (catheter) will be put into the needle. The tube will be moved to your heart. Dye may be put through the tube. This helps your doctor see your heart. Small devices (electrodes) on the tube will send out signals. A type of energy will be used to destroy some heart tissue. The tube will be taken out. Pressure will be held on your cut. This helps stop bleeding. A bandage will be put over your cut. The exact procedure may vary among doctors and hospitals. What happens after the procedure? You will be watched until you leave the hospital or clinic. This includes checking your heart rate, breathing rate, oxygen, and blood pressure. Your cut will be watched for bleeding. You will need to lie still for a few hours. Do not drive for 24 hours or as long as your doctor tells you. Summary Cardiac ablation is a procedure to destroy some heart tissue. This is done to treat heart rhythm problems. Tell your doctor about any medical conditions you may have. Tell him or her about all medicines you are taking to treat them. This is a safe procedure. But problems may occur. These include infection, bruising, bleeding, and damage to nearby areas of your body. Follow what your doctor tells you about food and drink. You may also be told to change or stop some of your medicines. After the procedure, do not drive for 24 hours or as long as your doctor tells you. This information is not intended to replace advice given to you by your health care provider. Make sure you discuss any questions you have with your health care  provider. Document Revised: 09/26/2021 Document Reviewed: 06/08/2019 Elsevier Patient Education  2023 [employer] Inc[employer]   Cardiac Ablation, Care After  This sheet gives you information about how to care for yourself after your procedure. Your health care provider may also give you more specific instructions. If you have problems or questions, contact your health care provider. What can I expect after the procedure? After the procedure, it is common to have: Bruising around your puncture site. Tenderness around your puncture site. Skipped heartbeats. If you had an atrial fibrillation ablation, you may have atrial fibrillation during the first several months after your procedure.  Tiredness (fatigue).  Follow these instructions at home: Puncture site care  Follow instructions from your health care provider about how to take care of your puncture site. Make sure you: If present, leave stitches (sutures), skin glue, or adhesive strips in place. These skin closures may need to stay in place for up to 2 weeks. If adhesive strip edges start to loosen and curl up, you may trim the loose edges. Do not remove adhesive strips completely unless your health care  provider tells you to do that. If a large square bandage is present, this may be removed 24 hours after surgery.  Check your puncture site every day for signs of infection. Check for: Redness, swelling, or pain. Fluid or blood. If your puncture site starts to bleed, lie down on your back, apply firm pressure to the area, and contact your health care provider. Warmth. Pus or a bad smell. A pea or small marble sized lump at the site is normal and can take up to three months to resolve.  Driving Do not drive for at least 4 days after your procedure or however long your health care provider recommends. (Do not resume driving if you have previously been instructed not to drive for other health reasons.) Do not drive or use heavy machinery while taking  prescription pain medicine. Activity Avoid activities that take a lot of effort for at least 7 days after your procedure. Do not lift anything that is heavier than 5 lb (4.5 kg) for one week.  No sexual activity for 1 week.  Return to your normal activities as told by your health care provider. Ask your health care provider what activities are safe for you. General instructions Take over-the-counter and prescription medicines only as told by your health care provider. Do not use any products that contain nicotine or tobacco, such as cigarettes and e-cigarettes. If you need help quitting, ask your health care provider. You may shower after 24 hours, but Do not take baths, swim, or use a hot tub for 1 week.  Do not drink alcohol for 24 hours after your procedure. Keep all follow-up visits as told by your health care provider. This is important. Contact a health care provider if: You have redness, mild swelling, or pain around your puncture site. You have fluid or blood coming from your puncture site that stops after applying firm pressure to the area. Your puncture site feels warm to the touch. You have pus or a bad smell coming from your puncture site. You have a fever. You have chest pain or discomfort that spreads to your neck, jaw, or arm. You have chest pain that is worse with lying on your back or taking a deep breath. You are sweating a lot. You feel nauseous. You have a fast or irregular heartbeat. You have shortness of breath. You are dizzy or light-headed and feel the need to lie down. You have pain or numbness in the arm or leg closest to your puncture site. Get help right away if: Your puncture site suddenly swells. Your puncture site is bleeding and the bleeding does not stop after applying firm pressure to the area. These symptoms may represent a serious problem that is an emergency. Do not wait to see if the symptoms will go away. Get medical help right away. Call your local  emergency services (911 in the [place] Do not drive yourself to the hospital. Summary After the procedure, it is normal to have bruising and tenderness at the puncture site in your groin, neck, or forearm. Check your puncture site every day for signs of infection. Get help right away if your puncture site is bleeding and the bleeding does not stop after applying firm pressure to the area. This is a medical emergency. This information is not intended to replace advice given to you by your health care provider. Make sure you discuss any questions you have with your health care provider.

## 2023-04-06 NOTE — Progress Notes (Signed)
Electrophysiology Office Note:   Date:  04/06/2023  ID:  Carla Schmidt, DOB [birth year], MRN [id number]  Primary Cardiologist: Donato Schultz, MD Electrophysiologist: Taylormarie Register Jorja Loa, MD      History of Present Illness:   Carla Schmidt is a 67 y.o. female with h/o atrial fibrillation seen today for  for Electrophysiology evaluation of atrial fibrillation at the request of Donato Schultz.    She wears an Apple Watch that confirmed episodes of atrial fibrillation.  She had an episode previously and went to the emergency room.  Adenosine was given and atrial fibrillation was seen during the pause.  When she is in atrial fibrillation, she was quite poorly with weakness, fatigue, shortness of breath.  She has had 5 episodes of atrial fibrillation this year.  Her first episode was prolonged, her other episodes have terminated without intervention.  She would prefer a rhythm control strategy would like to avoid medications.  Review of systems complete and found to be negative unless listed in HPI.   EP Information / Studies Reviewed:    EKG is ordered today. Personal review as below.  EKG Interpretation Date/Time:  Tuesday April 06 2023 13:11:20 EDT Ventricular Rate:  68 PR Interval:  156 QRS Duration:  84 QT Interval:  402 QTC Calculation: 427 R Axis:   63  Text Interpretation: Normal sinus rhythm Normal ECG No previous ECGs available Confirmed by Mariesa Grieder ([id number]) on 04/06/2023 1:50:14 PM     Risk Assessment/Calculations:    CHA2DS2-VASc Score = 2   This indicates a 2.2% annual risk of stroke. The patient's score is based upon: CHF History: 0 HTN History: 0 Diabetes History: 0 Stroke History: 0 Vascular Disease History: 0 Age Score: 1 Gender Score: 1              Physical Exam:   VS:  BP 118/68 (BP Location: Left Arm, Patient Position: Sitting, Cuff Size: Normal)   Pulse 68   Ht 5\' 3"  (1.6 m)   Wt 161 lb 12.8 oz (73.4 kg)   LMP 10/11/2010   SpO2 99%   BMI 28.66  kg/m    Wt Readings from Last 3 Encounters:  04/06/23 161 lb 12.8 oz (73.4 kg)  03/29/23 160 lb (72.6 kg)  08/04/22 160 lb 9.6 oz (72.8 kg)     GEN: Well nourished, well developed in no acute distress NECK: No JVD; No carotid bruits CARDIAC: Regular rate and rhythm, no murmurs, rubs, gallops RESPIRATORY:  Clear to auscultation without rales, wheezing or rhonchi  ABDOMEN: Soft, non-tender, non-distended EXTREMITIES:  No edema; No deformity   ASSESSMENT AND PLAN:    1.  Paroxysmal atrial fibrillation: Currently on as needed diltiazem and Eliquis.  She would prefer a rhythm control strategy.  She feels quite poorly in atrial fibrillation.  She would like to avoid long-term antiarrhythmics.  Due to that, we Donyell Carrell plan for ablation.  Risk and benefits have been discussed.  She understands the risks and is agreed to the procedure  Risk, benefits, and alternatives to EP study and radiofrequency/pulse field ablation for afib were also discussed in detail today. These risks include but are not limited to stroke, bleeding, vascular damage, tamponade, perforation, damage to the esophagus, lungs, and other structures, pulmonary vein stenosis, worsening renal function, and death. The patient understands these risk and wishes to proceed.  We Peirce Deveney therefore proceed with catheter ablation at the next available time.  Carto, ICE, anesthesia are requested for the procedure.  Tylisha Danis also obtain  CT PV protocol prior to the procedure to exclude LAA thrombus and further evaluate atrial anatomy.  Case was discussed with primary cardiology  Follow up with Dr. Elberta Fortis as usual post procedure  Signed, Chioke Noxon Jorja Loa, MD

## 2023-04-19 ENCOUNTER — Other Ambulatory Visit: Payer: Self-pay | Admitting: Cardiology

## 2023-04-19 NOTE — Telephone Encounter (Signed)
Prescription refill request for Eliquis received. Indication: PAF Last office visit: 04/06/23  Carleene Mains MD Scr: 0.91 on 08/10/22  KPN Age: 67 Weight: 73.4kg  Based on above findings Eliquis 5mg  twice daily is the appropriate dose.  Refill approved.

## 2023-04-22 DIAGNOSIS — Z23 Encounter for immunization: Secondary | ICD-10-CM | POA: Diagnosis not present

## 2023-05-28 DIAGNOSIS — H5213 Myopia, bilateral: Secondary | ICD-10-CM | POA: Diagnosis not present

## 2023-05-31 DIAGNOSIS — H5213 Myopia, bilateral: Secondary | ICD-10-CM | POA: Diagnosis not present

## 2023-07-07 DIAGNOSIS — K080 Exfoliation of teeth due to systemic causes: Secondary | ICD-10-CM | POA: Diagnosis not present

## 2023-07-16 ENCOUNTER — Other Ambulatory Visit: Payer: Self-pay

## 2023-07-16 DIAGNOSIS — I480 Paroxysmal atrial fibrillation: Secondary | ICD-10-CM

## 2023-07-28 DIAGNOSIS — I480 Paroxysmal atrial fibrillation: Secondary | ICD-10-CM | POA: Diagnosis not present

## 2023-07-29 LAB — CBC
Hematocrit: 38.8 % (ref 34.0–46.6)
Hemoglobin: 12.6 g/dL (ref 11.1–15.9)
MCH: 31.3 pg (ref 26.6–33.0)
MCHC: 32.5 g/dL (ref 31.5–35.7)
MCV: 96 fL (ref 79–97)
Platelets: 238 10*3/uL (ref 150–450)
RBC: 4.03 x10E6/uL (ref 3.77–5.28)
RDW: 12.6 % (ref 11.7–15.4)
WBC: 4.7 10*3/uL (ref 3.4–10.8)

## 2023-07-29 LAB — BASIC METABOLIC PANEL
BUN/Creatinine Ratio: 17 (ref 12–28)
BUN: 14 mg/dL (ref 8–27)
CO2: 22 mmol/L (ref 20–29)
Calcium: 9.4 mg/dL (ref 8.7–10.3)
Chloride: 104 mmol/L (ref 96–106)
Creatinine, Ser: 0.82 mg/dL (ref 0.57–1.00)
Glucose: 82 mg/dL (ref 70–99)
Potassium: 4.3 mmol/L (ref 3.5–5.2)
Sodium: 142 mmol/L (ref 134–144)
eGFR: 78 mL/min/{1.73_m2} (ref >59)

## 2023-08-05 ENCOUNTER — Ambulatory Visit (HOSPITAL_COMMUNITY)
Admission: RE | Admit: 2023-08-05 | Discharge: 2023-08-05 | Disposition: A | Discharge status: Home / Self Care | Payer: Medicare Other | Source: Ambulatory Visit | Attending: Cardiology | Admitting: Cardiology

## 2023-08-05 DIAGNOSIS — I480 Paroxysmal atrial fibrillation: Secondary | ICD-10-CM | POA: Insufficient documentation

## 2023-08-05 MED ORDER — IOHEXOL 350 MG/ML SOLN
100.0000 mL | Freq: Once | INTRAVENOUS | Status: AC | PRN
  Administered 2023-08-05: 100 mL via INTRAVENOUS

## 2023-08-11 DIAGNOSIS — E559 Vitamin D deficiency, unspecified: Secondary | ICD-10-CM | POA: Diagnosis not present

## 2023-08-11 DIAGNOSIS — Z79899 Other long term (current) drug therapy: Secondary | ICD-10-CM | POA: Diagnosis not present

## 2023-08-11 DIAGNOSIS — R7301 Impaired fasting glucose: Secondary | ICD-10-CM | POA: Diagnosis not present

## 2023-08-11 DIAGNOSIS — E7800 Pure hypercholesterolemia, unspecified: Secondary | ICD-10-CM | POA: Diagnosis not present

## 2023-08-11 DIAGNOSIS — R7309 Other abnormal glucose: Secondary | ICD-10-CM

## 2023-08-11 HISTORY — DX: Other abnormal glucose: R73.09

## 2023-08-18 DIAGNOSIS — Z1331 Encounter for screening for depression: Secondary | ICD-10-CM | POA: Diagnosis not present

## 2023-08-18 DIAGNOSIS — Z79899 Other long term (current) drug therapy: Secondary | ICD-10-CM | POA: Diagnosis not present

## 2023-08-18 DIAGNOSIS — R7301 Impaired fasting glucose: Secondary | ICD-10-CM | POA: Diagnosis not present

## 2023-08-18 DIAGNOSIS — Z0001 Encounter for general adult medical examination with abnormal findings: Secondary | ICD-10-CM | POA: Diagnosis not present

## 2023-08-18 DIAGNOSIS — E7800 Pure hypercholesterolemia, unspecified: Secondary | ICD-10-CM | POA: Diagnosis not present

## 2023-08-18 NOTE — Pre-Procedure Instructions (Signed)
Instructed patient on the following items: Arrival time 515 Nothing to eat or drink after midnight No meds AM of procedure Responsible person to drive you home and stay with you for 24 hrs  Have you missed any doses of anti-coagulant Eliquis- takes twice a day, hasn't missed any doses.  Don't take dose in the morning.

## 2023-08-19 ENCOUNTER — Other Ambulatory Visit: Payer: Self-pay

## 2023-08-19 ENCOUNTER — Encounter (HOSPITAL_COMMUNITY): Payer: Self-pay | Admitting: Cardiology

## 2023-08-19 ENCOUNTER — Ambulatory Visit (HOSPITAL_COMMUNITY)
Admission: RE | Admit: 2023-08-19 | Discharge: 2023-08-19 | Disposition: A | Discharge status: Home / Self Care | Payer: Medicare Other | Attending: Cardiology | Admitting: Cardiology

## 2023-08-19 ENCOUNTER — Encounter (HOSPITAL_COMMUNITY)
Admission: RE | Disposition: A | Discharge status: Home / Self Care | Payer: Medicare Other | Source: Home / Self Care | Attending: Cardiology

## 2023-08-19 ENCOUNTER — Ambulatory Visit (HOSPITAL_COMMUNITY): Payer: Self-pay

## 2023-08-19 ENCOUNTER — Ambulatory Visit (HOSPITAL_BASED_OUTPATIENT_CLINIC_OR_DEPARTMENT_OTHER): Payer: Medicare Other

## 2023-08-19 DIAGNOSIS — K219 Gastro-esophageal reflux disease without esophagitis: Secondary | ICD-10-CM | POA: Insufficient documentation

## 2023-08-19 DIAGNOSIS — E039 Hypothyroidism, unspecified: Secondary | ICD-10-CM | POA: Diagnosis not present

## 2023-08-19 DIAGNOSIS — I4819 Other persistent atrial fibrillation: Secondary | ICD-10-CM | POA: Diagnosis not present

## 2023-08-19 DIAGNOSIS — I4891 Unspecified atrial fibrillation: Secondary | ICD-10-CM | POA: Diagnosis not present

## 2023-08-19 HISTORY — PX: ATRIAL FIBRILLATION ABLATION: EP1191

## 2023-08-19 LAB — POCT ACTIVATED CLOTTING TIME: Activated Clotting Time: 354 s

## 2023-08-19 SURGERY — ATRIAL FIBRILLATION ABLATION
Anesthesia: General

## 2023-08-19 MED ORDER — MIDAZOLAM HCL 2 MG/2ML IJ SOLN
INTRAMUSCULAR | Status: DC | PRN
  Administered 2023-08-19: 2 mg via INTRAVENOUS

## 2023-08-19 MED ORDER — ATROPINE SULFATE 1 MG/10ML IJ SOSY
PREFILLED_SYRINGE | INTRAMUSCULAR | Status: DC | PRN
  Administered 2023-08-19: 1 mg via INTRAVENOUS

## 2023-08-19 MED ORDER — ONDANSETRON HCL 4 MG/2ML IJ SOLN
INTRAMUSCULAR | Status: DC | PRN
  Administered 2023-08-19: 4 mg via INTRAVENOUS

## 2023-08-19 MED ORDER — PHENYLEPHRINE 80 MCG/ML (10ML) SYRINGE FOR IV PUSH (FOR BLOOD PRESSURE SUPPORT)
PREFILLED_SYRINGE | INTRAVENOUS | Status: DC | PRN
  Administered 2023-08-19 (×2): 80 ug via INTRAVENOUS
  Administered 2023-08-19: 160 ug via INTRAVENOUS

## 2023-08-19 MED ORDER — LIDOCAINE 2% (20 MG/ML) 5 ML SYRINGE
INTRAMUSCULAR | Status: DC | PRN
  Administered 2023-08-19: 60 mg via INTRAVENOUS

## 2023-08-19 MED ORDER — PHENYLEPHRINE HCL-NACL 20-0.9 MG/250ML-% IV SOLN
INTRAVENOUS | Status: DC | PRN
  Administered 2023-08-19: 30 ug/min via INTRAVENOUS

## 2023-08-19 MED ORDER — FENTANYL CITRATE (PF) 250 MCG/5ML IJ SOLN
INTRAMUSCULAR | Status: DC | PRN
  Administered 2023-08-19: 50 ug via INTRAVENOUS

## 2023-08-19 MED ORDER — DEXAMETHASONE SODIUM PHOSPHATE 10 MG/ML IJ SOLN
INTRAMUSCULAR | Status: DC | PRN
  Administered 2023-08-19: 5 mg via INTRAVENOUS

## 2023-08-19 MED ORDER — FENTANYL CITRATE (PF) 100 MCG/2ML IJ SOLN
INTRAMUSCULAR | Status: AC
  Filled 2023-08-19: qty 2

## 2023-08-19 MED ORDER — PROTAMINE SULFATE 10 MG/ML IV SOLN
INTRAVENOUS | Status: DC | PRN
  Administered 2023-08-19: 40 mg via INTRAVENOUS

## 2023-08-19 MED ORDER — MIDAZOLAM HCL 2 MG/2ML IJ SOLN
INTRAMUSCULAR | Status: AC
  Filled 2023-08-19: qty 2

## 2023-08-19 MED ORDER — HEPARIN (PORCINE) IN NACL 1000-0.9 UT/500ML-% IV SOLN
INTRAVENOUS | Status: DC | PRN
  Administered 2023-08-19 (×3): 500 mL

## 2023-08-19 MED ORDER — ONDANSETRON HCL 4 MG/2ML IJ SOLN: 4.0000 mg | Freq: Four times a day (QID) | INTRAMUSCULAR | Status: DC | PRN

## 2023-08-19 MED ORDER — SODIUM CHLORIDE 0.9 % IV SOLN: 250.0000 mL | INTRAVENOUS | Status: DC | PRN

## 2023-08-19 MED ORDER — SODIUM CHLORIDE 0.9 % IV SOLN: INTRAVENOUS | Status: DC

## 2023-08-19 MED ORDER — SODIUM CHLORIDE 0.9% FLUSH: 3.0000 mL | INTRAVENOUS | Status: DC | PRN

## 2023-08-19 MED ORDER — SUGAMMADEX SODIUM 200 MG/2ML IV SOLN
INTRAVENOUS | Status: DC | PRN
  Administered 2023-08-19: 150 mg via INTRAVENOUS

## 2023-08-19 MED ORDER — HEPARIN SODIUM (PORCINE) 1000 UNIT/ML IJ SOLN
INTRAMUSCULAR | Status: DC | PRN
  Administered 2023-08-19: 14000 [IU] via INTRAVENOUS

## 2023-08-19 MED ORDER — SODIUM CHLORIDE 0.9% FLUSH: 3.0000 mL | Freq: Two times a day (BID) | INTRAVENOUS | Status: DC

## 2023-08-19 MED ORDER — ROCURONIUM BROMIDE 10 MG/ML (PF) SYRINGE
PREFILLED_SYRINGE | INTRAVENOUS | Status: DC | PRN
  Administered 2023-08-19: 60 mg via INTRAVENOUS
  Administered 2023-08-19: 10 mg via INTRAVENOUS

## 2023-08-19 MED ORDER — PROPOFOL 10 MG/ML IV BOLUS
INTRAVENOUS | Status: DC | PRN
  Administered 2023-08-19: 130 mg via INTRAVENOUS
  Administered 2023-08-19: 30 mg via INTRAVENOUS

## 2023-08-19 SURGICAL SUPPLY — 20 items
BAG SNAP BAND KOVER 36X36 (MISCELLANEOUS) IMPLANT
CABLE PFA RX CATH CONN (CABLE) IMPLANT
CATH FARAWAVE ABLATION 31 (CATHETERS) IMPLANT
CATH OCTARAY 2.0 F 3-3-3-3-3 (CATHETERS) IMPLANT
CATH SOUNDSTAR ECO 8FR (CATHETERS) IMPLANT
CATH WEBSTER BI DIR CS D-F CRV (CATHETERS) IMPLANT
CLOSURE MYNX CONTROL 6F/7F (Vascular Products) IMPLANT
CLOSURE PERCLOSE PROSTYLE (VASCULAR PRODUCTS) IMPLANT
COVER SWIFTLINK CONNECTOR (BAG) ×2 IMPLANT
DILATOR VESSEL 38 20CM 16FR (INTRODUCER) IMPLANT
GUIDEWIRE INQWIRE 1.5J.035X260 (WIRE) IMPLANT
INQWIRE 1.5J .035X260CM (WIRE) ×1 IMPLANT
KIT VERSACROSS CNCT FARADRIVE (KITS) IMPLANT
PACK EP LF (CUSTOM PROCEDURE TRAY) ×2 IMPLANT
PAD DEFIB RADIO PHYSIO CONN (PAD) ×2 IMPLANT
PATCH CARTO3 (PAD) IMPLANT
SHEATH FARADRIVE STEERABLE (SHEATH) IMPLANT
SHEATH PINNACLE 8F 10CM (SHEATH) IMPLANT
SHEATH PINNACLE 9F 10CM (SHEATH) IMPLANT
SHEATH PROBE COVER 6X72 (BAG) IMPLANT

## 2023-08-19 NOTE — Transfer of Care (Signed)
Immediate Anesthesia Transfer of Care Note  Patient: Carla Schmidt  Procedure(s) Performed: ATRIAL FIBRILLATION ABLATION  Patient Location: Cath Lab  Anesthesia Type:General  Level of Consciousness: drowsy  Airway & Oxygen Therapy: Patient Spontanous Breathing  Post-op Assessment: Report given to RN and Post -op Vital signs reviewed and stable  Post vital signs: Reviewed and stable  Last Vitals:  Vitals Value Taken Time  BP 136/85 08/19/23 0904  Temp    Pulse 111 08/19/23 0906  Resp 16 08/19/23 0906  SpO2 93 % 08/19/23 0906  Vitals shown include unfiled device data.  Last Pain:  Vitals:   08/19/23 0628  TempSrc: Oral         Complications: There were no known notable events for this encounter.

## 2023-08-19 NOTE — Progress Notes (Signed)
Up and walked and tolerated well; bilat groins stable, no bleeding or hematoma

## 2023-08-19 NOTE — Anesthesia Procedure Notes (Signed)
Procedure Name: Intubation Date/Time: 08/19/2023 7:47 AM  Performed by: Darlina Guys, CRNAPre-anesthesia Checklist: Patient identified, Emergency Drugs available, Suction available and Patient being monitored Patient Re-evaluated:Patient Re-evaluated prior to induction Oxygen Delivery Method: Circle System Utilized Preoxygenation: Pre-oxygenation with 100% oxygen Induction Type: IV induction Ventilation: Mask ventilation without difficulty Laryngoscope Size: Mac and 3 Grade View: Grade I Tube type: Oral Tube size: 7.0 mm Number of attempts: 1 Airway Equipment and Method: Stylet and Oral airway Placement Confirmation: ETT inserted through vocal cords under direct vision, positive ETCO2 and breath sounds checked- equal and bilateral Secured at: 21 cm Tube secured with: Tape Dental Injury: Teeth and Oropharynx as per pre-operative assessment  Comments: Atraumatic induction/intubation. Dentition and oral mucosa as per preop.

## 2023-08-19 NOTE — H&P (Signed)
  Electrophysiology Office Note:   Date:  08/19/2023  ID:  Carla Schmidt, DOB 10-Aug-1955, MRN [id number]  Primary Cardiologist: Donato Schultz, MD Electrophysiologist: Earland Reish Jorja Loa, MD      History of Present Illness:   Carla Schmidt is a 68 y.o. female with h/o atrial fibrillation seen today for  for Electrophysiology evaluation of atrial fibrillation at the request of Donato Schultz.    Today, denies symptoms of palpitations, chest pain, shortness of breath, orthopnea, PND, lower extremity edema, claudication, dizziness, presyncope, syncope, bleeding, or neurologic sequela. The patient is tolerating medications without difficulties. Plan AF ablation today.   EP Information / Studies Reviewed:    EKG is ordered today. Personal review as below.        Risk Assessment/Calculations:    CHA2DS2-VASc Score =     This indicates a  % annual risk of stroke. The patient's score is based upon:                Physical Exam:   VS:  BP 124/75   Pulse 82   Temp 98.6 F (37 C) (Oral)   Resp 16   Ht 5\' 3"  (1.6 m)   Wt 72.6 kg   LMP 10/11/2010   SpO2 97%   BMI 28.34 kg/m    Wt Readings from Last 3 Encounters:  08/19/23 72.6 kg  04/06/23 73.4 kg  03/29/23 72.6 kg    GEN: No acute distress.   Neck: No JVD Cardiac: RRR, no murmurs, rubs, or gallops.  Respiratory: decreased BS bases bilaterally. GI: Soft, nontender, non-distended  MS: No edema; No deformity. Neuro:  Nonfocal  Skin: warm and dry Psych: Normal affect    ASSESSMENT AND PLAN:    1.  Paroxysmal atrial fibrillation: [name] has presented today for surgery, with the diagnosis of AF.  The various methods of treatment have been discussed with the patient and family. After consideration of risks, benefits and other options for treatment, the patient has consented to  Procedure(s): Catheter ablation as a surgical intervention .  Risks include but not limited to complete heart block, stroke, esophageal damage, nerve  damage, bleeding, vascular damage, tamponade, perforation, MI, and death. The patient's history has been reviewed, patient examined, no change in status, stable for surgery.  I have reviewed the patient's chart and labs.  Questions were answered to the patient's satisfaction.    Rozelle Caudle Elberta Fortis, MD 08/19/2023 7:13 AM

## 2023-08-19 NOTE — Anesthesia Preprocedure Evaluation (Signed)
Anesthesia Evaluation  Patient identified by MRN, date of birth, ID band Patient awake    Reviewed: Allergy & Precautions, NPO status , Patient's Chart, lab work & pertinent test results  History of Anesthesia Complications Negative for: history of anesthetic complications  Airway Mallampati: I  TM Distance: >3 FB Neck ROM: Full    Dental  (+) Teeth Intact, Dental Advisory Given   Pulmonary neg pulmonary ROS   breath sounds clear to auscultation       Cardiovascular + dysrhythmias Atrial Fibrillation  Rhythm:Regular     Neuro/Psych negative neurological ROS  negative psych ROS   GI/Hepatic Neg liver ROS,GERD  ,,  Endo/Other  Hypothyroidism    Renal/GU negative Renal ROS     Musculoskeletal  (+) Arthritis ,    Abdominal   Peds  Hematology negative hematology ROS (+)   Anesthesia Other Findings   Reproductive/Obstetrics                             Anesthesia Physical Anesthesia Plan  ASA: 2  Anesthesia Plan: General   Post-op Pain Management: Minimal or no pain anticipated   Induction: Intravenous  PONV Risk Score and Plan: 3 and Ondansetron, Dexamethasone, Propofol infusion and TIVA  Airway Management Planned: Oral ETT  Additional Equipment: None  Intra-op Plan:   Post-operative Plan: Extubation in OR  Informed Consent: I have reviewed the patients History and Physical, chart, labs and discussed the procedure including the risks, benefits and alternatives for the proposed anesthesia with the patient or authorized representative who has indicated his/her understanding and acceptance.     Dental advisory given  Plan Discussed with: CRNA  Anesthesia Plan Comments:        Anesthesia Quick Evaluation

## 2023-08-20 NOTE — Anesthesia Postprocedure Evaluation (Signed)
Anesthesia Post Note  Patient: Carla Schmidt  Procedure(s) Performed: ATRIAL FIBRILLATION ABLATION     Patient location during evaluation: Cath Lab Anesthesia Type: General Level of consciousness: awake and alert Pain management: pain level controlled Vital Signs Assessment: post-procedure vital signs reviewed and stable Respiratory status: spontaneous breathing, nonlabored ventilation and respiratory function stable Cardiovascular status: blood pressure returned to baseline and stable Postop Assessment: no apparent nausea or vomiting Anesthetic complications: no   There were no known notable events for this encounter.                Jerran Tappan

## 2023-08-23 ENCOUNTER — Telehealth: Payer: Self-pay | Admitting: Cardiology

## 2023-08-23 NOTE — Telephone Encounter (Signed)
Spoke with pt who denies any new detergents, lotions, soap, etc.  She has reached out to PCP but hasn't heard back yet.  She has taken Claritin and will use hydrocortisone cream for now.  She will call back if no improvement.

## 2023-08-23 NOTE — Telephone Encounter (Signed)
Message to patient with recommendation from [place] to send a picture of the rash through MyChart.

## 2023-08-23 NOTE — Telephone Encounter (Signed)
Called patient back about her message. Patient stated she has a rash that started yesterday, that started on her lower back and has started this morning on her abdomen. Patient stated it is itchy, welt like, and red. Will forward to Canary Brim NP and Otilio Saber PA for advisement since Dr. Elberta Fortis is out.

## 2023-08-23 NOTE — Telephone Encounter (Signed)
Patient stated she had a cardiac ablation last Thursday and now has a rash which started on her back and has now progressed to her abdomen.

## 2023-09-16 ENCOUNTER — Encounter (HOSPITAL_COMMUNITY): Payer: Self-pay | Admitting: Internal Medicine

## 2023-09-16 ENCOUNTER — Ambulatory Visit (HOSPITAL_COMMUNITY)
Admission: RE | Admit: 2023-09-16 | Discharge: 2023-09-16 | Disposition: A | Discharge status: Home / Self Care | Payer: Medicare Other | Source: Ambulatory Visit | Attending: Internal Medicine | Admitting: Internal Medicine

## 2023-09-16 VITALS — BP 116/76 | HR 92 | Ht 63.0 in | Wt 165.6 lb

## 2023-09-16 DIAGNOSIS — Z7901 Long term (current) use of anticoagulants: Secondary | ICD-10-CM | POA: Insufficient documentation

## 2023-09-16 DIAGNOSIS — E039 Hypothyroidism, unspecified: Secondary | ICD-10-CM | POA: Insufficient documentation

## 2023-09-16 DIAGNOSIS — I480 Paroxysmal atrial fibrillation: Secondary | ICD-10-CM | POA: Diagnosis not present

## 2023-09-16 DIAGNOSIS — E785 Hyperlipidemia, unspecified: Secondary | ICD-10-CM | POA: Insufficient documentation

## 2023-09-16 NOTE — Progress Notes (Signed)
 Primary Care Physician: Darrow Bussing, MD Primary Cardiologist: Donato Schultz, MD Electrophysiologist: Will Jorja Loa, MD     Referring Physician: Dr. Glory Buff is a 68 y.o. female with a history of hypothyroidism, HLD, and paroxysmal atrial fibrillation who presents for consultation in the [facility]. Patient is on Eliquis 5 mg BID for a CHADS2VASC score of 2.  On evaluation today, patient is currently in NSR. S/p Afib ablation on 08/19/23 by Dr. Elberta Fortis. No episodes of Afib since ablation. No chest pain or SOB. Leg sites healed without issue. No missed doses of Eliquis 5 mg BID.  Today, she denies symptoms of orthopnea, PND, lower extremity edema, dizziness, presyncope, syncope, snoring, daytime somnolence, bleeding, or neurologic sequela. The patient is tolerating medications without difficulties and is otherwise without complaint today.    she has a BMI of Body mass index is 29.33 kg/m.Marland Kitchen Filed Weights   09/16/23 1125  Weight: 75.1 kg    Current Outpatient Medications  Medication Sig Dispense Refill   acetaminophen (TYLENOL) 500 MG tablet Take 1,000 mg by mouth every 8 (eight) hours as needed for mild pain or moderate pain.     apixaban (ELIQUIS) 5 MG TABS tablet TAKE 1 TABLET(5 MG) BY MOUTH TWICE DAILY 60 tablet 5   clobetasol cream (TEMOVATE) 0.05 % Apply 1 Application topically 2 (two) times daily as needed (skin itching/irritation.).     diltiazem (CARDIZEM) 30 MG tablet Take 1 tablet (30 mg total) by mouth 4 (four) times daily as needed. 30 tablet 3   lansoprazole (PREVACID) 30 MG capsule Take 30 mg by mouth daily before breakfast.     levothyroxine (SYNTHROID) 50 MCG tablet TAKE 1 TABLET(50 MCG) BY MOUTH DAILY BEFORE BREAKFAST 90 tablet 3   PREVIDENT 5000 BOOSTER PLUS 1.1 % PSTE Place 1 Application onto teeth in the morning and at bedtime.     rosuvastatin (CRESTOR) 5 MG tablet Take 5 mg by mouth daily. AT NIGHT      sertraline (ZOLOFT) 50 MG tablet Take 1.5 tablets (75 mg total) by mouth daily. 135 tablet 3   Vitamin D, Ergocalciferol, (DRISDOL) 1.25 MG (50000 UNIT) CAPS capsule TAKE 1 CAPSULE BY MOUTH EVERY 2 WEEKS 6 capsule 3   zolpidem (AMBIEN) 5 MG tablet Take 5 mg by mouth at bedtime as needed (insomnia due to travel).     No current facility-administered medications for this encounter.    Atrial Fibrillation Management history:  Previous antiarrhythmic drugs: none Previous cardioversions: none Previous ablations: 08/19/23 Anticoagulation history: Eliquis   ROS- All systems are reviewed and negative except as per the HPI above.  Physical Exam: BP 116/76   Pulse 92   Ht 5\' 3"  (1.6 m)   Wt 75.1 kg   LMP 10/11/2010   BMI 29.33 kg/m   GEN: Well nourished, well developed in no acute distress NECK: No JVD; No carotid bruits CARDIAC: Regular rate and rhythm, no murmurs, rubs, gallops RESPIRATORY:  Clear to auscultation without rales, wheezing or rhonchi  ABDOMEN: Soft, non-tender, non-distended EXTREMITIES:  No edema; No deformity   EKG today demonstrates  Vent. rate 92 BPM PR interval 150 ms QRS duration 82 ms QT/QTcB 356/440 ms P-R-T axes 24 51 -7 Normal sinus rhythm Nonspecific ST and T wave abnormality Abnormal ECG When compared with ECG of 19-Aug-2023 09:08, PREVIOUS ECG IS PRESENT  Echo 09/03/22 demonstrated  1. Left ventricular ejection fraction, by estimation, is 60 to 65%. The  left ventricle has normal function. The left ventricle has no regional  wall motion abnormalities. Left ventricular diastolic parameters are  indeterminate.   2. Right ventricular systolic function is normal. The right ventricular  size is normal. There is normal pulmonary artery systolic pressure.   3. The mitral valve is normal in structure. Mild mitral valve  regurgitation. No evidence of mitral stenosis.   4. Tricuspid valve regurgitation is mild to moderate.   5. The aortic valve is normal  in structure. Aortic valve regurgitation is  mild. No aortic stenosis is present.   6. The inferior vena cava is normal in size with greater than 50%  respiratory variability, suggesting right atrial pressure of 3 mmHg.   ASSESSMENT & PLAN CHA2DS2-VASc Score = 2  The patient's score is based upon: CHF History: 0 HTN History: 0 Diabetes History: 0 Stroke History: 0 Vascular Disease History: 0 Age Score: 1 Gender Score: 1       ASSESSMENT AND PLAN: Paroxysmal Atrial Fibrillation (ICD10:  I48.0) The patient's CHA2DS2-VASc score is 2, indicating a 2.2% annual risk of stroke.    S/p Afib ablation on 08/19/23 by Dr. Elberta Fortis.  She is currently in NSR.  Continue Eliquis without interruption.    Follow up as scheduled with EP.    Lake Bells, PA-C  Afib Clinic [facility] [address] [place], [place] [address] [phone]

## 2023-10-06 ENCOUNTER — Encounter: Payer: Self-pay | Admitting: Obstetrics and Gynecology

## 2023-10-06 ENCOUNTER — Ambulatory Visit
Admission: RE | Admit: 2023-10-06 | Discharge: 2023-10-06 | Disposition: A | Discharge status: Home / Self Care | Payer: Medicare Other | Source: Ambulatory Visit | Attending: Obstetrics and Gynecology | Admitting: Obstetrics and Gynecology

## 2023-10-06 DIAGNOSIS — N958 Other specified menopausal and perimenopausal disorders: Secondary | ICD-10-CM | POA: Diagnosis not present

## 2023-10-06 DIAGNOSIS — M8580 Other specified disorders of bone density and structure, unspecified site: Secondary | ICD-10-CM

## 2023-10-06 DIAGNOSIS — E2839 Other primary ovarian failure: Secondary | ICD-10-CM | POA: Diagnosis not present

## 2023-10-06 DIAGNOSIS — M8588 Other specified disorders of bone density and structure, other site: Secondary | ICD-10-CM | POA: Diagnosis not present

## 2023-10-11 NOTE — Telephone Encounter (Signed)
Routing to Dr. Silva FYI.  

## 2023-10-26 ENCOUNTER — Encounter (INDEPENDENT_AMBULATORY_CARE_PROVIDER_SITE_OTHER): Payer: Self-pay

## 2023-10-26 ENCOUNTER — Ambulatory Visit (INDEPENDENT_AMBULATORY_CARE_PROVIDER_SITE_OTHER): Payer: Medicare Other | Admitting: Otolaryngology

## 2023-10-26 ENCOUNTER — Ambulatory Visit (INDEPENDENT_AMBULATORY_CARE_PROVIDER_SITE_OTHER): Payer: Medicare Other | Admitting: Audiology

## 2023-10-26 VITALS — BP 121/78 | HR 81 | Ht 63.0 in | Wt 160.0 lb

## 2023-10-26 DIAGNOSIS — H903 Sensorineural hearing loss, bilateral: Secondary | ICD-10-CM

## 2023-10-26 DIAGNOSIS — H6121 Impacted cerumen, right ear: Secondary | ICD-10-CM | POA: Diagnosis not present

## 2023-10-26 DIAGNOSIS — H9041 Sensorineural hearing loss, unilateral, right ear, with unrestricted hearing on the contralateral side: Secondary | ICD-10-CM | POA: Diagnosis not present

## 2023-10-26 DIAGNOSIS — H9190 Unspecified hearing loss, unspecified ear: Secondary | ICD-10-CM

## 2023-10-26 NOTE — Progress Notes (Signed)
 Dear Dr. Docia Chuck, Here is my assessment for our mutual patient, Carla Schmidt. Thank you for allowing me the opportunity to care for your patient. Please do not hesitate to contact me should you have any other questions. Sincerely, Dr. Jovita Kussmaul  Otolaryngology Clinic Note Referring provider: Dr. Docia Chuck HPI:  Carla Schmidt is a 68 y.o. female kindly referred by Dr. Docia Chuck for evaluation of hearing loss.   Initial visit (10/2023): Patient reports: ongoing hearing loss for more than 5 years; most trouble with background noise. Thinks both ears are declining but right may be worse.  Patient denies: ear pain, fullness, vertigo, drainage, tinnitus Patient additionally denies: deep pain in ear canal, eustachian tube symptoms such as popping/crackling, sensitive to pressure changes Patient also denies barotrauma, vestibular suppressant use, ototoxic medication use Prior ear surgery: no NO frequent ear infections. No recent hearing tests. No FHX of hearing loss early onset. No significant noise exposure.  Personal or FHx of bleeding dz or anesthesia difficulty: no   GLP-1: no AP/AC: Eliquis  Tobacco: no. 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  Independent Review of Additional Tests or Records:  Dr. Glendale Chard referral notes reviewed in media tab (08/25/2023) 10/26/2023 Audiogram was independently reviewed and interpreted by me and it reveals - asymmetric b/l SNHL - right worse than left; SRT 20dB AD, AS 10dB; 100% WRT at 60dB AD, 50dB AS   SNHL= Sensorineural hearing loss    PMH/Meds/All/SocHx/FamHx/ROS:   Past Medical History:  Diagnosis Date   Adenomyosis 11/2008    on PUS   Anxiety    claustrophobic    Atrial fibrillation (HCC)    Bursitis    hip   COVID 07/2020   Elevated LDL cholesterol level 2017   194 on 10/08/16   Fibroid 11/2008   Gall stone 12/22/2016   8 mm stone in gallbladder by ultrasound   Gastroesophageal reflux disease without esophagitis 12/18/2016   History of colon  polyps    Hypothyroidism    Osteoarthritis    hands /rt shoulder   Osteoporosis 01/2016   BMD at [facility].  T score sping -2.9, total left hip -2.5.  Foxamax started and stopped due to reflux.     Past Surgical History:  Procedure Laterality Date   ATRIAL FIBRILLATION ABLATION N/A 08/19/2023   Procedure: ATRIAL FIBRILLATION ABLATION;  Surgeon: Regan Lemming, MD;  Location: [facility] INVASIVE CV LAB;  Service: Cardiovascular;  Laterality: N/A;   CHOLECYSTECTOMY N/A 01/18/2017   Procedure: LAPAROSCOPIC CHOLECYSTECTOMY WITH INTRAOPERATIVE CHOLANGIOGRAM;  Surgeon: Abigail Miyamoto, MD;  Location: [facility] OR;  Service: General;  Laterality: N/A;   COLONOSCOPY     TONSILLECTOMY AND ADENOIDECTOMY     age 22    Family History  Problem Relation Age of Onset   Cirrhosis Mother    Breast cancer Neg Hx      Social Connections: Not on file      Current Outpatient Medications:    acetaminophen (TYLENOL) 500 MG tablet, Take 1,000 mg by mouth every 8 (eight) hours as needed for mild pain or moderate pain., Disp: , Rfl:    apixaban (ELIQUIS) 5 MG TABS tablet, TAKE 1 TABLET(5 MG) BY MOUTH TWICE DAILY, Disp: 60 tablet, Rfl: 5   clobetasol cream (TEMOVATE) 0.05 %, Apply 1 Application topically 2 (two) times daily as needed (skin itching/irritation.)., Disp: , Rfl:    diltiazem (CARDIZEM) 30 MG tablet, Take 1 tablet (30 mg total) by mouth 4 (four) times daily as needed., Disp: 30 tablet, Rfl: 3   lansoprazole (  PREVACID) 30 MG capsule, Take 30 mg by mouth daily before breakfast., Disp: , Rfl:    levothyroxine (SYNTHROID) 50 MCG tablet, TAKE 1 TABLET(50 MCG) BY MOUTH DAILY BEFORE BREAKFAST, Disp: 90 tablet, Rfl: 3   PREVIDENT 5000 BOOSTER PLUS 1.1 % PSTE, Place 1 Application onto teeth in the morning and at bedtime., Disp: , Rfl:    rosuvastatin (CRESTOR) 5 MG tablet, Take 5 mg by mouth daily. AT NIGHT, Disp: , Rfl:    sertraline (ZOLOFT) 50 MG tablet, Take 1.5 tablets (75 mg total) by mouth daily.,  Disp: 135 tablet, Rfl: 3   Vitamin D, Ergocalciferol, (DRISDOL) 1.25 MG (50000 UNIT) CAPS capsule, TAKE 1 CAPSULE BY MOUTH EVERY 2 WEEKS, Disp: 6 capsule, Rfl: 3   zolpidem (AMBIEN) 5 MG tablet, Take 5 mg by mouth at bedtime as needed (insomnia due to travel). (Patient not taking: Reported on 10/26/2023), Disp: , Rfl:    Physical Exam:   BP 121/78 (BP Location: Right Arm, Patient Position: Sitting, Cuff Size: Normal)   Pulse 81   Ht 5\' 3"  (1.6 m)   Wt 160 lb (72.6 kg)   LMP 10/11/2010   SpO2 93%   BMI 28.34 kg/m   Salient findings:  CN II-XII intact Given history and complaints, ear microscopy was indicated and performed for evaluation with findings as below in physical exam section and in procedures; right ear cerumen impaction, cleared; after clearance, Bilateral EAC clear and TM intact with well pneumatized middle ear spaces Weber 512: mid Rinne 512: AC > BC b/l  No lesions of oral cavity/oropharynx No obviously palpable neck masses/lymphadenopathy/thyromegaly No respiratory distress or stridor  Seprately Identifiable Procedures:  Procedure: Bilateral ear microscopy and cerumen removal using microscope (CPT [phone]) - Mod 25 Pre-procedure diagnosis: Cerumen impaction Right external ears Post-procedure diagnosis: same Indication: Right cerumen impaction; given patient's otologic complaints and history as well as for improved and comprehensive examination of external ear and tympanic membrane, bilateral otologic examination using microscope was performed and impacted cerumen removed  Procedure: Patient was placed semi-recumbent. Both ear canals were examined using the microscope with findings above. Impacted Cerumen removed on right using suction and currette with improvement in EAC examination and patency. Patient tolerated the procedure well.      Impression & Plans:  Carla Schmidt is a 68 y.o. female with:  1. Asymmetrical sensorineural hearing loss   2. Subjective hearing loss    3. Impacted cerumen of right ear    Right asymmetric SNHL, no other sx; we discussed options and given asymmetry, would recommend MRI IAC; however, patient deferred MRI due to claustrophobia As such, f/u in 1 year with audio; if she changes her mind, can order earlier  See below regarding exact medications prescribed this encounter including dosages and route: No orders of the defined types were placed in this encounter.     Thank you for allowing me the opportunity to care for your patient. Please do not hesitate to contact me should you have any other questions.  Sincerely, Jovita Kussmaul, MD Otolaryngologist (ENT), [facility] Phone: [phone] Fax: [phone]  10/26/2023, 1:03 PM   MDM:  Level [id number] Complexity/Problems addressed: low Data complexity: low - independent review of notes, tests - Morbidity: low?  - Prescription Drug prescribed or managed: no

## 2023-10-26 NOTE — Progress Notes (Signed)
  [address] [place], [place] [address] [phone]  Audiological Evaluation    Name: Carla Schmidt     DOB:   02-04-1956      MRN:   [id number]                                                                                     Service Date: 10/26/2023     Accompanied by: unaccompanied   Patient comes today after Dr. Allena Katz, ENT sent a referral for a hearing evaluation due to concerns with hearing loss.   Symptoms Yes Details  Hearing loss  [x]  Reports hearing loss  Tinnitus  []    Ear pain/ infections/pressure  [x]  Feels stopped up today - maybe pollen  Balance problems  []    Noise exposure history  []    Previous ear surgeries  []    Family history of hearing loss  [x]  Brother with age  Amplification  []    Other  []      Otoscopy: Right ear: Clear external ear canals and notable landmarks visualized on the tympanic membrane. Left ear:  Clear external ear canals and notable landmarks visualized on the tympanic membrane.  Tympanometry: Right ear: Type A- Normal external ear canal volume with normal middle ear pressure and tympanic membrane compliance Left ear: Type A- Normal external ear canal volume with normal middle ear pressure and tympanic membrane compliance   Pure tone Audiometry: Right ear- Borderline normal to mild sensorineural hearing loss rising to normal from 1000-2000 Hz, then mild to  sensorineural hearing loss from 4000 Hz - 8000 Hz. Left ear-  Normal to moderate sensorineural hearing loss from 125 Hz - 8000 Hz.  Speech Audiometry: Right ear- Speech Reception Threshold (SRT) was obtained at 20 dBHL. Left ear-Speech Reception Threshold (SRT) was obtained at 10 dBHL.   Word Recognition Score Tested using NU-6 (MLV) Right ear: 100% was obtained at a presentation level of 60 dBHL with contralateral masking which is deemed as  excellent. Left ear: 100% was obtained at a presentation level of 50 dBHL with contralateral masking which is deemed as   excellent.   The hearing test results were completed under headphones and re-checked with inserts and results are deemed to be of good reliability. Test technique:  conventional    Recommendations: Follow up with ENT as scheduled for today. Return for a hearing evaluation if concerns with hearing changes arise or per MD recommendation. Consider a communication needs assessment after medical clearance for hearing aids is obtained, pending patient motivation.   Payson Crumby MARIE LEROUX-MARTINEZ, AUD

## 2023-10-27 LAB — LAB REPORT - SCANNED: A1c: 5.9

## 2023-11-10 NOTE — Progress Notes (Signed)
 Electrophysiology Office Note:   Date:  11/15/2023  ID:  KOUTURE LANDIN, DOB [birth year], MRN [id number]  Primary Cardiologist: Dorothye Gathers, MD Primary Heart Failure: None Electrophysiologist: Will Cortland Ding, MD      History of Present Illness:   Carla Schmidt is a 68 y.o. female, retired primary care MD, with h/o AF seen today for routine electrophysiology followup.   Since last being seen in our clinic the patient reports she has been doing well. No evidence of recurrent AF.  She asks if she can come off her anticoagulation.  Has an Scientist, physiological.  She denies chest pain, palpitations, dyspnea, PND, orthopnea, nausea, vomiting, dizziness, syncope, edema, weight gain, or early satiety.   Review of systems complete and found to be negative unless listed in HPI.   EP Information / Studies Reviewed:    EKG is ordered today. Personal review as below.  EKG Interpretation Date/Time:  Monday November 15 2023 15:28:18 EDT Ventricular Rate:  67 PR Interval:  166 QRS Duration:  86 QT Interval:  394 QTC Calculation: 416 R Axis:   24  Text Interpretation: Normal sinus rhythm Confirmed by Creighton Doffing ([id number]) on 11/15/2023 4:39:20 PM   Studies:  CT Cardiac Scoring 03/2022 > CCS 6 / 54th percentile for matched controls ECHO 08/2022 > LVEF 60-65%, mild MV regurgitation, mild-mod TV regurgitation CT Cardiac Morphology 07/2023 > mild bi-atrial enlargement, chicken wing appendage, no ASD/PFO, normal PV anatomy, CCS 13.1 / 57th percentile for matched controls EPS 08/19/23 > SR on presentation, successful electrical isolation & anatomical encircling of all 4 PV's with PF ablation, ablation of posterior wall with PF ablation  Arrhythmia / AAD Paroxysmal AF    Risk Assessment/Calculations:    CHA2DS2-VASc Score = 2   This indicates a 2.2% annual risk of stroke. The patient's score is based upon: CHF History: 0 HTN History: 0 Diabetes History: 0 Stroke History: 0 Vascular Disease History:  0 Age Score: 1 Gender Score: 1              Physical Exam:   VS:  BP 108/70 (BP Location: Left Arm, Patient Position: Sitting, Cuff Size: Normal)   Pulse 67   Ht 5\' 3"  (1.6 m)   Wt 164 lb (74.4 kg)   LMP 10/11/2010   BMI 29.05 kg/m    Wt Readings from Last 3 Encounters:  11/15/23 164 lb (74.4 kg)  10/26/23 160 lb (72.6 kg)  09/16/23 165 lb 9.6 oz (75.1 kg)     GEN: Well nourished, well developed in no acute distress NECK: No JVD; No carotid bruits CARDIAC: Regular rate and rhythm, no murmurs, rubs, gallops RESPIRATORY:  Clear to auscultation without rales, wheezing or rhonchi  ABDOMEN: Soft, non-tender, non-distended EXTREMITIES:  No edema; No deformity   ASSESSMENT AND PLAN:    Paroxysmal Atrial Fibrillation  CHA2DS2-VASc 2, s/p PVI & posterior wall ablation  -OAC for stroke prophylaxis  -continue PRN cardizem  if any AF symptoms -no symptom burden since ablation    -monitors with an Apple Watch   Secondary Hypercoagulable State  -stop Eliquis , reviewed with Dr. Lawana Pray in clinic > normal atrial size on ECHO, SR on presentation to ablation, CHAD's of 2 for age / female  -shared decision  / discussed risks of stroke with patient and risks of being off anticoagulation. She indicates understanding to call if recurrent AF symptoms.  -discussed loop recorder if she wanted more definitive form of heart rhythm monitoring    Follow up with Dr.  Camnitz in 12 months  Signed, Creighton Doffing, NP-C, AGACNP-BC [facility] - Electrophysiology  11/15/2023, 5:06 PM

## 2023-11-15 ENCOUNTER — Ambulatory Visit: Payer: Medicare Other | Attending: Pulmonary Disease | Admitting: Pulmonary Disease

## 2023-11-15 ENCOUNTER — Encounter: Payer: Self-pay | Admitting: Pulmonary Disease

## 2023-11-15 VITALS — BP 108/70 | HR 67 | Ht 63.0 in | Wt 164.0 lb

## 2023-11-15 DIAGNOSIS — D6869 Other thrombophilia: Secondary | ICD-10-CM

## 2023-11-15 DIAGNOSIS — I480 Paroxysmal atrial fibrillation: Secondary | ICD-10-CM | POA: Diagnosis not present

## 2023-11-15 NOTE — Patient Instructions (Signed)
 Medication Instructions:  Stop Eliquis  *If you need a refill on your cardiac medications before your next appointment, please call your pharmacy*  Lab Work: None ordered If you have labs (blood work) drawn today and your tests are completely normal, you will receive your results only by: MyChart Message (if you have MyChart) OR A paper copy in the mail If you have any lab test that is abnormal or we need to change your treatment, we will call you to review the results.  Follow-Up: At [facility], you and your health needs are our priority.  As part of our continuing mission to provide you with exceptional heart care, our providers are all part of one team.  This team includes your primary Cardiologist (physician) and Advanced Practice Providers or APPs (Physician Assistants and Nurse Practitioners) who all work together to provide you with the care you need, when you need it.  Your next appointment:   1 year(s)  Provider:   You may see Will Cortland Ding, MD or one of the following Advanced Practice Providers on your designated Care Team:   Mertha Abrahams, [place] [address] [place], [place] Creighton Doffing, NP

## 2024-01-20 ENCOUNTER — Other Ambulatory Visit: Payer: Self-pay | Admitting: Obstetrics and Gynecology

## 2024-01-20 DIAGNOSIS — Z1231 Encounter for screening mammogram for malignant neoplasm of breast: Secondary | ICD-10-CM

## 2024-01-24 DIAGNOSIS — K080 Exfoliation of teeth due to systemic causes: Secondary | ICD-10-CM | POA: Diagnosis not present

## 2024-02-18 ENCOUNTER — Ambulatory Visit
Admission: RE | Admit: 2024-02-18 | Discharge: 2024-02-18 | Disposition: A | Discharge status: Home / Self Care | Source: Ambulatory Visit | Attending: Obstetrics and Gynecology | Admitting: Obstetrics and Gynecology

## 2024-02-18 DIAGNOSIS — Z1231 Encounter for screening mammogram for malignant neoplasm of breast: Secondary | ICD-10-CM | POA: Diagnosis not present

## 2024-02-22 ENCOUNTER — Ambulatory Visit: Payer: Self-pay | Admitting: Obstetrics and Gynecology

## 2024-03-24 NOTE — Progress Notes (Unsigned)
 68 y.o. G0P0000 Single Caucasian female here for a breast and pelvic exam.    The patient is also followed for hypothyroidism, low vit D, anxiety and osteoporosis.   She feels good on her current Synthroid  dose.   Prefers to take high dose vit D.   Zoloft  working well.   Will do a radiofrequency echographic multi spectrometry for bone assessment.  This will be done at [facility] and performed by an orthopedist.   Has been doing Osteostrong.    Took Fosamax  for osteopenia.  She stopped due to concern about potential reflux.  Had gallstones at the same time.   Had cardiac ablation and atrial fibrillation resolved.  Off Eliquis .   Patient brings in a copy of her labs from [facility] - 08/11/23: A1C 5.9. Vit D 42.5 Cr 0.87 Calcium 9.5 T chol 182 TG 74 HDL 81 LDL 87 TSH 2.32 Hgb 13.6  PCP: Koirala, Dibas, MD   Patient's last menstrual period was 10/11/2010.           Sexually active: No.  The current method of family planning is post menopausal status.    Menopausal hormone therapy:  n/a Exercising: Yes.    Tennis and walking Smoker:  no  OB History     Gravida  0   Para  0   Term  0   Preterm  0   AB  0   Living  0      SAB  0   IAB  0   Ectopic  0   Multiple  0   Live Births  0           HEALTH MAINTENANCE: Last 2 paps: 03/25/21 neg HR HPV neg, 03/11/18 neg HPV neg History of abnormal Pap or positive HPV:  no Mammogram:  02/18/24 Breast Density Cat C, BIRADS cat 1 neg  Colonoscopy:  03/18/22 - due in 2028 - Dr. Kristie Bone Density:  10/06/23  Result  osteoporotic - left femur.   Immunization History  Administered Date(s) Administered   MMR 07/14/2018   Zoster Recombinant(Shingrix) 12/23/2017, 02/25/2018      reports that she has never smoked. She has never used smokeless tobacco. She reports current alcohol use of about 4.0 - 5.0 standard drinks of alcohol per week. She reports that she does not use drugs.  Past Medical History:   Diagnosis Date   Adenomyosis 11/2008    on PUS   Anxiety    claustrophobic    Atrial fibrillation (HCC)    Bursitis    hip   COVID 07/2020   Elevated hemoglobin A1c 08/11/2023   5.9   Elevated LDL cholesterol level 2017   194 on 10/08/16   Fibroid 11/2008   Gall stone 12/22/2016   8 mm stone in gallbladder by ultrasound   Gastroesophageal reflux disease without esophagitis 12/18/2016   History of colon polyps    Hypothyroidism    Osteoarthritis    hands /rt shoulder   Osteoporosis 01/2016   BMD at [facility].  T score sping -2.9, total left hip -2.5.  Foxamax started and stopped due to reflux.    Past Surgical History:  Procedure Laterality Date   ATRIAL FIBRILLATION ABLATION N/A 08/19/2023   Procedure: ATRIAL FIBRILLATION ABLATION;  Surgeon: Inocencio Soyla Lunger, MD;  [facility]: [facility];  Service: Cardiovascular;  Laterality: N/A;   CHOLECYSTECTOMY N/A 01/18/2017   Procedure: LAPAROSCOPIC CHOLECYSTECTOMY WITH INTRAOPERATIVE CHOLANGIOGRAM;  Surgeon: Vernetta Berg, MD;  Location: [facility] OR;  Service: General;  Laterality: N/A;   COLONOSCOPY     TONSILLECTOMY AND ADENOIDECTOMY     age 22    Current Outpatient Medications  Medication Sig Dispense Refill   acetaminophen  (TYLENOL ) 500 MG tablet Take 1,000 mg by mouth every 8 (eight) hours as needed for mild pain or moderate pain.     clobetasol cream (TEMOVATE) 0.05 % Apply 1 Application topically 2 (two) times daily as needed (skin itching/irritation.).     lansoprazole (PREVACID) 30 MG capsule Take 30 mg by mouth daily before breakfast.     PREVIDENT 5000 BOOSTER PLUS 1.1 % PSTE Place 1 Application onto teeth in the morning and at bedtime.     rosuvastatin (CRESTOR) 5 MG tablet Take 5 mg by mouth daily. AT NIGHT     levothyroxine  (SYNTHROID ) 50 MCG tablet TAKE 1 TABLET(50 MCG) BY MOUTH DAILY BEFORE BREAKFAST 90 tablet 3   sertraline  (ZOLOFT ) 50 MG tablet Take 1.5 tablets (75 mg total) by mouth daily. 135 tablet 3    Vitamin D , Ergocalciferol , (DRISDOL ) 1.25 MG (50000 UNIT) CAPS capsule TAKE 1 CAPSULE BY MOUTH EVERY 2 WEEKS 6 capsule 3   No current facility-administered medications for this visit.    ALLERGIES: Fentanyl  and Tylenol  with codeine #3 [acetaminophen -codeine]  Family History  Problem Relation Age of Onset   Cirrhosis Mother    Breast cancer Neg Hx    BRCA 1/2 Neg Hx     Review of Systems  All other systems reviewed and are negative.   PHYSICAL EXAM:  BP 114/64 (BP Location: Left Arm, Patient Position: Sitting)   Pulse 90   Ht 5' 3.75 (1.619 m)   Wt 160 lb (72.6 kg)   LMP 10/11/2010   SpO2 96%   BMI 27.68 kg/m     General appearance: alert, cooperative and appears stated age Head: normocephalic, without obvious abnormality, atraumatic Neck: no adenopathy, supple, symmetrical, trachea midline and thyroid  normal to inspection and palpation Lungs: clear to auscultation bilaterally Breasts: normal appearance, no masses or tenderness, No nipple retraction or dimpling, No nipple discharge or bleeding, No axillary adenopathy Heart: regular rate and rhythm Abdomen: soft, non-tender; no masses, no organomegaly Extremities: extremities normal, atraumatic, no cyanosis or edema Skin: skin color, texture, turgor normal. No rashes or lesions Lymph nodes: cervical, supraclavicular, and axillary nodes normal. Neurologic: grossly normal  Pelvic: External genitalia:  no lesions              No abnormal inguinal nodes palpated.              Urethra:  normal appearing urethra with no masses, tenderness or lesions              Bartholins and Skenes: normal                 Vagina: normal appearing vagina with normal color and discharge, no lesions              Cervix: no lesions              Pap taken: no Bimanual Exam:  Uterus:  normal size, contour, position, consistency, mobility, non-tender              Adnexa: no mass, fullness, tenderness              Rectal exam: yes.  Confirms.               Anus:  normal sphincter tone, no lesions  Chaperone was present  for exam:  Jada M, CMA  ASSESSMENT: Encounter for breast and pelvic exam.  Personal history of other medical treatment.  Hypothyroidism.  Osteoporosis.  GERD. Hx vit D deficiency.  Hx anxiety.  On Zoloft . Hyperlipidemia.  Atrial fibrillation.  Status post ablation.  Elevated A1C, prediabetes. Encounter for medication monitoring.  PLAN: Mammogram screening discussed. Self breast awareness reviewed. Pap and HRV collected:  no.  Due in 2027.  Guidelines for Calcium, Vitamin D , regular exercise program including cardiovascular and weight bearing exercise. Medication refills:  Synthroid  50 mcg daily, Zoloft  75 mg daily, vit D 50,000 units every 2 weeks.  Patient will proceed with US  evaluation of her bone density at [facility] and patient may consider tx for osteoporosis.  We may consider Actonel .  Prolia was discussed and the risk of compression fractures of the spine if discontinued without adopting an additional tx.  Reclast may be associated with atrial fibrillation.  May want to a check of PTH, phosphorus prior to starting bone support medication.  Follow up:  yearly and prn.    Additional counseling given.  yes. 30 min  total time was spent for this patient encounter, including preparation, face-to-face counseling with the patient, coordination of care, and documentation of the encounter in addition to doing the breast and pelvic exam.

## 2024-03-30 ENCOUNTER — Encounter: Payer: Self-pay | Admitting: Obstetrics and Gynecology

## 2024-03-30 ENCOUNTER — Ambulatory Visit (INDEPENDENT_AMBULATORY_CARE_PROVIDER_SITE_OTHER): Payer: Medicare Other | Admitting: Obstetrics and Gynecology

## 2024-03-30 VITALS — BP 114/64 | HR 90 | Ht 63.75 in | Wt 160.0 lb

## 2024-03-30 DIAGNOSIS — Z01419 Encounter for gynecological examination (general) (routine) without abnormal findings: Secondary | ICD-10-CM

## 2024-03-30 DIAGNOSIS — M810 Age-related osteoporosis without current pathological fracture: Secondary | ICD-10-CM

## 2024-03-30 DIAGNOSIS — Z8742 Personal history of other diseases of the female genital tract: Secondary | ICD-10-CM

## 2024-03-30 DIAGNOSIS — Z8639 Personal history of other endocrine, nutritional and metabolic disease: Secondary | ICD-10-CM

## 2024-03-30 DIAGNOSIS — F419 Anxiety disorder, unspecified: Secondary | ICD-10-CM

## 2024-03-30 DIAGNOSIS — Z5181 Encounter for therapeutic drug level monitoring: Secondary | ICD-10-CM

## 2024-03-30 DIAGNOSIS — Z9289 Personal history of other medical treatment: Secondary | ICD-10-CM | POA: Diagnosis not present

## 2024-03-30 DIAGNOSIS — E039 Hypothyroidism, unspecified: Secondary | ICD-10-CM

## 2024-03-30 MED ORDER — LEVOTHYROXINE SODIUM 50 MCG PO TABS: ORAL_TABLET | ORAL | 3 refills | Status: AC

## 2024-03-30 MED ORDER — VITAMIN D (ERGOCALCIFEROL) 1.25 MG (50000 UNIT) PO CAPS: ORAL_CAPSULE | ORAL | 3 refills | Status: AC

## 2024-03-30 MED ORDER — SERTRALINE HCL 50 MG PO TABS: 75.0000 mg | ORAL_TABLET | Freq: Every day | ORAL | 3 refills | Status: AC

## 2024-03-30 NOTE — Patient Instructions (Signed)

## 2024-04-02 ENCOUNTER — Encounter: Payer: Self-pay | Admitting: Obstetrics and Gynecology

## 2024-04-10 ENCOUNTER — Encounter: Payer: Self-pay | Admitting: Obstetrics and Gynecology

## 2024-05-23 ENCOUNTER — Telehealth (INDEPENDENT_AMBULATORY_CARE_PROVIDER_SITE_OTHER): Payer: Self-pay

## 2024-05-23 NOTE — Telephone Encounter (Signed)
 Pt called stated she had a question did confirm she has not seen Tobie since February

## 2024-05-29 DIAGNOSIS — H5213 Myopia, bilateral: Secondary | ICD-10-CM | POA: Diagnosis not present

## 2024-06-06 ENCOUNTER — Encounter (INDEPENDENT_AMBULATORY_CARE_PROVIDER_SITE_OTHER): Payer: Self-pay | Admitting: Otolaryngology

## 2024-06-06 ENCOUNTER — Ambulatory Visit (INDEPENDENT_AMBULATORY_CARE_PROVIDER_SITE_OTHER): Admitting: Otolaryngology

## 2024-06-06 VITALS — BP 130/77 | HR 94 | Ht 63.0 in | Wt 160.0 lb

## 2024-06-06 DIAGNOSIS — H9313 Tinnitus, bilateral: Secondary | ICD-10-CM

## 2024-06-06 DIAGNOSIS — H903 Sensorineural hearing loss, bilateral: Secondary | ICD-10-CM | POA: Diagnosis not present

## 2024-06-06 DIAGNOSIS — H8110 Benign paroxysmal vertigo, unspecified ear: Secondary | ICD-10-CM | POA: Diagnosis not present

## 2024-06-06 MED ORDER — DIAZEPAM 5 MG PO TABS: 5.0000 mg | ORAL_TABLET | Freq: Two times a day (BID) | ORAL | 0 refills | Status: AC | PRN

## 2024-06-06 NOTE — Progress Notes (Addendum)
 Dear Dr. Regino, Here is my assessment for our mutual patient, Carla Schmidt. Thank you for allowing me the opportunity to care for your patient. Please do not hesitate to contact me should you have any other questions. Sincerely, Dr. Eldora Blanch  Otolaryngology Clinic Note Referring provider: Dr. Regino HPI:  Carla Schmidt is a 68 y.o. female kindly referred by Dr. Regino for evaluation of hearing loss.   Initial visit (10/2023): Patient reports: ongoing hearing loss for more than 5 years; most trouble with background noise. Thinks both ears are declining but right may be worse.  Patient denies: ear pain, fullness, vertigo, drainage, tinnitus Patient additionally denies: deep pain in ear canal, eustachian tube symptoms such as popping/crackling, sensitive to pressure changes Patient also denies barotrauma, vestibular suppressant use, ototoxic medication use Prior ear surgery: no NO frequent ear infections. No recent hearing tests. No FHX of hearing loss early onset. No significant noise exposure.  --------------------------------------------------------- 06/06/2024 She did not get an MRI due to claustrophobia. She reports that she started to have tinnitus (bilaterally) - not affecting her significantly but there Schmidt time. No clear antecedent event. Had brief episode of dizziness (looking up) - appears to have BPPV. No hearing fluctuation or fullness. No N/V. No audio since last visit. No facial numbness or weakness  Personal or FHx of bleeding dz or anesthesia difficulty: no   GLP-1: no AP/AC: Eliquis   Tobacco: no. 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  Independent Review of Additional Tests or Records:  Dr. Geroge referral notes reviewed in media tab (08/25/2023) 10/26/2023 Audiogram was independently reviewed and interpreted by me and it reveals - asymmetric b/l SNHL - right worse than left; SRT 20dB AD, AS 10dB; 100% WRT at 60dB AD, 50dB AS   SNHL= Sensorineural hearing  loss  PMH/Meds/Schmidt/SocHx/FamHx/ROS:   Past Medical History:  Diagnosis Date   Adenomyosis 11/2008    on PUS   Anxiety    claustrophobic    Atrial fibrillation (HCC)    Bursitis    hip   COVID 07/2020   Elevated hemoglobin A1c 08/11/2023   5.9   Elevated LDL cholesterol level 2017   194 on 10/08/16   Fibroid 11/2008   Gall stone 12/22/2016   8 mm stone in gallbladder by ultrasound   Gastroesophageal reflux disease without esophagitis 12/18/2016   History of colon polyps    Hypothyroidism    Osteoarthritis    hands /rt shoulder   Osteoporosis 01/2016   BMD at [facility].  T score sping -2.9, total left hip -2.5.  Foxamax started and stopped due to reflux.     Past Surgical History:  Procedure Laterality Date   ATRIAL FIBRILLATION ABLATION N/A 08/19/2023   Procedure: ATRIAL FIBRILLATION ABLATION;  Surgeon: Inocencio Soyla Lunger, MD;  [facility];  Service: Cardiovascular;  Laterality: N/A;   CHOLECYSTECTOMY N/A 01/18/2017   Procedure: LAPAROSCOPIC CHOLECYSTECTOMY WITH INTRAOPERATIVE CHOLANGIOGRAM;  Surgeon: Vernetta Berg, MD;  Location: [facility] OR;  Service: General;  Laterality: N/A;   COLONOSCOPY     TONSILLECTOMY AND ADENOIDECTOMY     age 54    Family History  Problem Relation Age of Onset   Cirrhosis Mother    Breast cancer Neg Hx    BRCA 1/2 Neg Hx      Social Connections: Not on file      Current Outpatient Medications:    acetaminophen  (TYLENOL ) 500 MG tablet, Take 1,000 mg by mouth every 8 (eight) hours as needed for mild pain or moderate pain.,  Disp: , Rfl:    clobetasol cream (TEMOVATE) 0.05 %, Apply 1 Application topically 2 (two) times daily as needed (skin itching/irritation.)., Disp: , Rfl:    diazepam (VALIUM) 5 MG tablet, Take 1 tablet (5 mg total) by mouth every 12 (twelve) hours as needed for anxiety (for pre MRI)., Disp: 1 tablet, Rfl: 0   lansoprazole (PREVACID) 30 MG capsule, Take 30 mg by mouth daily before breakfast., Disp: ,  Rfl:    levothyroxine  (SYNTHROID ) 50 MCG tablet, TAKE 1 TABLET(50 MCG) BY MOUTH DAILY BEFORE BREAKFAST, Disp: 90 tablet, Rfl: 3   PREVIDENT 5000 BOOSTER PLUS 1.1 % PSTE, Place 1 Application onto teeth in the morning and at bedtime., Disp: , Rfl:    rosuvastatin (CRESTOR) 5 MG tablet, Take 5 mg by mouth daily. AT NIGHT, Disp: , Rfl:    sertraline  (ZOLOFT ) 50 MG tablet, Take 1.5 tablets (75 mg total) by mouth daily., Disp: 135 tablet, Rfl: 3   Vitamin D , Ergocalciferol , (DRISDOL ) 1.25 MG (50000 UNIT) CAPS capsule, TAKE 1 CAPSULE BY MOUTH EVERY 2 WEEKS, Disp: 6 capsule, Rfl: 3   Physical Exam:   BP 130/77 (BP Location: Right Arm, Patient Position: Sitting, Cuff Size: Large)   Pulse 94   Ht 5' 3 (1.6 m)   Wt 160 lb (72.6 kg)   LMP 10/11/2010   SpO2 95%   BMI 28.34 kg/m   Salient findings:  CN II-XII intact Bilateral EAC clear and TM intact with well pneumatized middle ear spaces No objective pulsatile tinnitus No lesions of oral cavity/oropharynx No obviously palpable neck masses/lymphadenopathy/thyromegaly No respiratory distress or stridor No gross nystagmus  Seprately Identifiable Procedures:  None today     Impression & Plans:  Carla Schmidt is a 68 y.o. female with:  1. Asymmetrical sensorineural hearing loss   2. Sensorineural hearing loss, bilateral   3. Tinnitus of both ears   4. Benign paroxysmal positional vertigo, unspecified laterality    Right asymmetric SNHL, now with tinnitus; dizziness resolved, suspect BPPV. we discussed options and given asymmetry, would recommend MRI IAC which she now wishes to do  Given recent onset tinnitus, can consider masking but will wait for updated audio  Will pre-med with valium given claustrophobia F/u 8 weeks with MRI and audio  See below regarding exact medications prescribed this encounter including dosages and route: Meds ordered this encounter  Medications   diazepam (VALIUM) 5 MG tablet    Sig: Take 1 tablet (5 mg total)  by mouth every 12 (twelve) hours as needed for anxiety (for pre MRI).    Dispense:  1 tablet    Refill:  0      Thank you for allowing me the opportunity to care for your patient. Please do not hesitate to contact me should you have any other questions.  Sincerely, Eldora Blanch, MD Otolaryngologist (ENT), [facility] Phone: [phone] Fax: [phone]  06/06/2024, 9:07 AM   MDM:  Level 99213 Complexity/Problems addressed: mod - chronic problem, new problem Data complexity: low  - Morbidity: mod - Prescription Drug prescribed or managed: y

## 2024-06-06 NOTE — Addendum Note (Signed)
 Addended by: Aveen Stansel on: 06/06/2024 09:20 AM   Modules accepted: Level of Service

## 2024-06-06 NOTE — Patient Instructions (Signed)
 I have ordered an imaging study for you to complete prior to your next visit. Please call [facility] Scheduling at [phone] to schedule your imaging if you have not received a call within 24 hours. If you are unable to complete your imaging study prior to your next scheduled visit please call our office to let us  know.

## 2024-06-12 DIAGNOSIS — H2513 Age-related nuclear cataract, bilateral: Secondary | ICD-10-CM | POA: Diagnosis not present

## 2024-07-11 ENCOUNTER — Ambulatory Visit
Admission: RE | Admit: 2024-07-11 | Discharge: 2024-07-11 | Disposition: A | Discharge status: Home / Self Care | Source: Ambulatory Visit | Attending: Otolaryngology | Admitting: Otolaryngology

## 2024-07-11 DIAGNOSIS — H903 Sensorineural hearing loss, bilateral: Secondary | ICD-10-CM

## 2024-07-11 DIAGNOSIS — H9313 Tinnitus, bilateral: Secondary | ICD-10-CM

## 2024-07-11 MED ORDER — GADOPICLENOL 0.5 MMOL/ML IV SOLN
7.0000 mL | Freq: Once | INTRAVENOUS | Status: AC | PRN
  Administered 2024-07-11: 7 mL via INTRAVENOUS

## 2024-08-01 ENCOUNTER — Ambulatory Visit (INDEPENDENT_AMBULATORY_CARE_PROVIDER_SITE_OTHER): Admitting: Audiology

## 2024-08-01 ENCOUNTER — Encounter (INDEPENDENT_AMBULATORY_CARE_PROVIDER_SITE_OTHER): Payer: Self-pay | Admitting: Otolaryngology

## 2024-08-01 ENCOUNTER — Ambulatory Visit (INDEPENDENT_AMBULATORY_CARE_PROVIDER_SITE_OTHER): Admitting: Otolaryngology

## 2024-08-01 VITALS — BP 130/82 | HR 71 | Temp 97.7°F

## 2024-08-01 DIAGNOSIS — H9313 Tinnitus, bilateral: Secondary | ICD-10-CM

## 2024-08-01 DIAGNOSIS — H903 Sensorineural hearing loss, bilateral: Secondary | ICD-10-CM

## 2024-08-01 DIAGNOSIS — H90A21 Sensorineural hearing loss, unilateral, right ear, with restricted hearing on the contralateral side: Secondary | ICD-10-CM

## 2024-08-01 NOTE — Progress Notes (Signed)
 Dear Dr. Regino, Here is my assessment for our mutual patient, Carla Schmidt. Thank you for allowing me the opportunity to care for your patient. Please do not hesitate to contact me should you have any other questions. Sincerely, Dr. Eldora Blanch  Otolaryngology Clinic Note Referring provider: Dr. Regino HPI:  Carla Schmidt is a 69 y.o. female kindly referred by Dr. Regino for evaluation of hearing loss.   Initial visit (10/2023): Patient reports: ongoing hearing loss for more than 5 years; most trouble with background noise. Thinks both ears are declining but right may be worse.  Patient denies: ear pain, fullness, vertigo, drainage, tinnitus Patient additionally denies: deep pain in ear canal, eustachian tube symptoms such as popping/crackling, sensitive to pressure changes Patient also denies barotrauma, vestibular suppressant use, ototoxic medication use Prior ear surgery: no NO frequent ear infections. No recent hearing tests. No FHX of hearing loss early onset. No significant noise exposure.  --------------------------------------------------------- 06/06/2024 She did not get an MRI due to claustrophobia. She reports that she started to have tinnitus (bilaterally) - not affecting her significantly but there all time. No clear antecedent event. Had brief episode of dizziness (looking up) - appears to have BPPV. No hearing fluctuation or fullness. No N/V. No audio since last visit. No facial numbness or weakness --------------------------------------------------------- 08/01/2024 Seen in follow up. Continued noted tinnitus, no meniere's symptoms. We did discuss her MRI.    Personal or FHx of bleeding dz or anesthesia difficulty: no   GLP-1: no AP/AC: Eliquis   Tobacco: no. 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  Independent Review of Additional Tests or Records:  Dr. Geroge referral notes reviewed in media tab (08/25/2023) 10/26/2023 Audiogram was independently reviewed and interpreted  by me and it reveals - asymmetric b/l SNHL - right worse than left; SRT 20dB AD, AS 10dB; 100% WRT at 60dB AD, 50dB AS   SNHL= Sensorineural hearing loss  07/2024 Audiogram was independently reviewed and interpreted by me and it reveals - essentially stable asymmetric SNHL with some worsening in higher frequencies (4-8kHz); A/A tymps; WRT 96/100% AD/AS at 75/60dB HL  SNHL= Sensorineural hearing loss  MRI IAC 07/11/2024 independently interpreted with respect to ears: no noted IAC lesions, mastoids well aerated AU  PMH/Meds/All/SocHx/FamHx/ROS:   Past Medical History:  Diagnosis Date   Adenomyosis 11/2008    on PUS   Anxiety    claustrophobic    Atrial fibrillation (HCC)    Bursitis    hip   COVID 07/2020   Elevated hemoglobin A1c 08/11/2023   5.9   Elevated LDL cholesterol level 2017   194 on 10/08/16   Fibroid 11/2008   Gall stone 12/22/2016   8 mm stone in gallbladder by ultrasound   Gastroesophageal reflux disease without esophagitis 12/18/2016   History of colon polyps    Hypothyroidism    Osteoarthritis    hands /rt shoulder   Osteoporosis 01/2016   BMD at [facility].  T score sping -2.9, total left hip -2.5.  Foxamax started and stopped due to reflux.     Past Surgical History:  Procedure Laterality Date   ATRIAL FIBRILLATION ABLATION N/A 08/19/2023   Procedure: ATRIAL FIBRILLATION ABLATION;  Surgeon: Inocencio Soyla Lunger, MD;  [facility];  Service: Cardiovascular;  Laterality: N/A;   CHOLECYSTECTOMY N/A 01/18/2017   Procedure: LAPAROSCOPIC CHOLECYSTECTOMY WITH INTRAOPERATIVE CHOLANGIOGRAM;  Surgeon: Vernetta Berg, MD;  Location: [facility] OR;  Service: General;  Laterality: N/A;   COLONOSCOPY     TONSILLECTOMY AND ADENOIDECTOMY  age 31    Family History  Problem Relation Age of Onset   Cirrhosis Mother    Breast cancer Neg Hx    BRCA 1/2 Neg Hx      Social Connections: Not on file      Current Outpatient Medications:     acetaminophen  (TYLENOL ) 500 MG tablet, Take 1,000 mg by mouth every 8 (eight) hours as needed for mild pain or moderate pain., Disp: , Rfl:    clobetasol cream (TEMOVATE) 0.05 %, Apply 1 Application topically 2 (two) times daily as needed (skin itching/irritation.)., Disp: , Rfl:    diazepam  (VALIUM ) 5 MG tablet, Take 1 tablet (5 mg total) by mouth every 12 (twelve) hours as needed for anxiety (for pre MRI)., Disp: 1 tablet, Rfl: 0   lansoprazole (PREVACID) 30 MG capsule, Take 30 mg by mouth daily before breakfast., Disp: , Rfl:    levothyroxine  (SYNTHROID ) 50 MCG tablet, TAKE 1 TABLET(50 MCG) BY MOUTH DAILY BEFORE BREAKFAST, Disp: 90 tablet, Rfl: 3   PREVIDENT 5000 BOOSTER PLUS 1.1 % PSTE, Place 1 Application onto teeth in the morning and at bedtime., Disp: , Rfl:    rosuvastatin (CRESTOR) 5 MG tablet, Take 5 mg by mouth daily. AT NIGHT, Disp: , Rfl:    sertraline  (ZOLOFT ) 50 MG tablet, Take 1.5 tablets (75 mg total) by mouth daily., Disp: 135 tablet, Rfl: 3   Vitamin D , Ergocalciferol , (DRISDOL ) 1.25 MG (50000 UNIT) CAPS capsule, TAKE 1 CAPSULE BY MOUTH EVERY 2 WEEKS, Disp: 6 capsule, Rfl: 3   Physical Exam:   BP 130/82   Pulse 71   Temp 97.7 F (36.5 C)   LMP 10/11/2010   SpO2 94%   Salient findings:  CN II-XII intact Bilateral EAC clear and TM intact with well pneumatized middle ear spaces No lesions of oral cavity/oropharynx No respiratory distress or stridor No gross nystagmus  Seprately Identifiable Procedures:  None today     Impression & Plans:  Carla Schmidt is a 69 y.o. female with:  1. Asymmetrical sensorineural hearing loss   2. Sensorineural hearing loss, bilateral   3. Tinnitus of both ears    Right asymmetric SNHL, now with tinnitus; suspect BPPV additionally. MRI IAC without retrocochlear lesions.    Is interested in HA and will refer D/w pt f/u, she opts to just get her care at [facility] for HA -- advised her to check her hearing there in 1 year  See below  regarding exact medications prescribed this encounter including dosages and route: No orders of the defined types were placed in this encounter.     Thank you for allowing me the opportunity to care for your patient. Please do not hesitate to contact me should you have any other questions.  Sincerely, Eldora Blanch, MD Otolaryngologist (ENT), [facility] Phone: [phone] Fax: [phone]  08/01/2024, 10:50 AM   MDM:  [phone] Complexity/Problems addressed: mod - chronic problem Data complexity: mod  - Morbidity: low - Prescription Drug prescribed or managed: n

## 2024-08-01 NOTE — Progress Notes (Signed)
 " [address] [place], [place] [address] [phone]  Audiological Evaluation    Name: Carla Schmidt     DOB:   10/15/1955      MRN:   [id number]                                                                                     Service Date: 08/01/2024     Accompanied by: self    Patient comes today after Dr. Tobie, ENT sent a referral for a hearing evaluation due to concerns with hearing loss.   Symptoms Yes Details  Hearing loss  [x]  10/26/2023:  Right ear- Borderline normal to mild sensorineural hearing loss rising to normal from 1000-2000 Hz, then mild to  sensorineural hearing loss from 4000 Hz - 8000 Hz. Left ear-  Normal to moderate sensorineural hearing loss from 125 Hz - 8000 Hz.  Tinnitus  [x]  Reports now has tinnitus.  Ear pain/ infections/pressure  []    Balance problems  [x]  Feels like going to fall, noticed when looking up, sometimes feels like spinning, lasting seconds  Noise exposure history  []    Previous ear surgeries  []    Family history of hearing loss  []    Amplification  []    Other  []  Reports ocular migraines.    Otoscopy: Right ear: Clear external ear canal and notable landmarks visualized on the tympanic membrane. Left ear:  Clear external ear canal and notable landmarks visualized on the tympanic membrane.  Tympanometry: Right ear: Type A - Normal external ear canal volume with normal middle ear pressure and normal tympanic membrane compliance. Findings are consistent with normal middle ear function. Left ear: Type A - Normal external ear canal volume with normal middle ear pressure and normal tympanic membrane compliance. Findings are consistent with normal middle ear function.  Hearing Evaluation The hearing test results were completed under headphones and re-checked with inserts and results are deemed to be of good reliability. Test technique:  conventional    Pure tone Audiometry: Right ear- Mild sensorineural hearing loss 125 Hz -  500 Hz and then within the normal hearing range 1000 Hz - 4000 Hz, and then a moderate to moderately severe presumably sensorineural hearing loss. 6000 Hz - 8000 Hz. Left ear-  Hearing within the normal range 125- 3000 Hz and then mild sensorineural hearing loss, 4000 Hz to 8000 Hz  Speech Audiometry: Right ear- Speech Reception Threshold (SRT) was obtained at 30 dBHL. Left ear-Speech Reception Threshold (SRT) was obtained at 10 dBHL.   Word Recognition Score Tested using NU-6 (recorded) Right ear: 96% was obtained at a presentation level of 75 dBHL with contralateral masking which is deemed as  excellent. Left ear: 100% was obtained at a presentation level of 60 dBHL with contralateral masking which is deemed as  excellent.   Impression: There is a significant difference in pure-tone thresholds between ears. There is not a significant difference in the word recognition score in between ears. Slight hearing decline in the right ear when compared to previous audiogram on file from 10-26-2023.   Recommendations: Follow up with ENT as scheduled. Return for a hearing evaluation if  concerns with hearing changes arise or per MD recommendation. Consider a communication needs assessment for amplification after medical clearance is obtained, if needed.   Abriana Saltos MARIE LEROUX-MARTINEZ, AUD   "

## 2024-08-11 ENCOUNTER — Encounter: Payer: Self-pay | Admitting: Audiology

## 2024-11-13 ENCOUNTER — Ambulatory Visit: Admitting: Pulmonary Disease

## 2025-04-02 ENCOUNTER — Ambulatory Visit: Admitting: Obstetrics and Gynecology
